# Patient Record
Sex: Male | Born: 1935
Health system: Southern US, Community
[De-identification: ages and names within clinical notes are randomized; demographics above are authoritative.]

## PROBLEM LIST (undated history)

## (undated) DIAGNOSIS — G4700 Insomnia, unspecified: Secondary | ICD-10-CM

## (undated) DIAGNOSIS — N400 Enlarged prostate without lower urinary tract symptoms: Secondary | ICD-10-CM

## (undated) DIAGNOSIS — F329 Major depressive disorder, single episode, unspecified: Secondary | ICD-10-CM

## (undated) DIAGNOSIS — R413 Other amnesia: Secondary | ICD-10-CM

## (undated) DIAGNOSIS — M549 Dorsalgia, unspecified: Secondary | ICD-10-CM

## (undated) DIAGNOSIS — E039 Hypothyroidism, unspecified: Secondary | ICD-10-CM

## (undated) DIAGNOSIS — H269 Unspecified cataract: Secondary | ICD-10-CM

## (undated) DIAGNOSIS — G8929 Other chronic pain: Secondary | ICD-10-CM

## (undated) DIAGNOSIS — F419 Anxiety disorder, unspecified: Secondary | ICD-10-CM

## (undated) DIAGNOSIS — Z860100 Personal history of colon polyps, unspecified: Secondary | ICD-10-CM

## (undated) DIAGNOSIS — R42 Dizziness and giddiness: Secondary | ICD-10-CM

## (undated) DIAGNOSIS — J449 Chronic obstructive pulmonary disease, unspecified: Secondary | ICD-10-CM

## (undated) DIAGNOSIS — E785 Hyperlipidemia, unspecified: Secondary | ICD-10-CM

## (undated) DIAGNOSIS — I2510 Atherosclerotic heart disease of native coronary artery without angina pectoris: Secondary | ICD-10-CM

## (undated) DIAGNOSIS — M1990 Unspecified osteoarthritis, unspecified site: Secondary | ICD-10-CM

## (undated) DIAGNOSIS — F32A Depression, unspecified: Secondary | ICD-10-CM

## (undated) DIAGNOSIS — M2550 Pain in unspecified joint: Secondary | ICD-10-CM

## (undated) DIAGNOSIS — I959 Hypotension, unspecified: Secondary | ICD-10-CM

## (undated) HISTORY — PX: BACK SURGERY: SHX140

## (undated) HISTORY — DX: Chronic obstructive pulmonary disease, unspecified: J44.9

## (undated) HISTORY — DX: Other amnesia: R41.3

## (undated) HISTORY — DX: Dizziness and giddiness: R42

## (undated) HISTORY — DX: Depression, unspecified: F32.A

## (undated) HISTORY — DX: Benign prostatic hyperplasia without lower urinary tract symptoms: N40.0

## (undated) HISTORY — DX: Hypothyroidism, unspecified: E03.9

## (undated) HISTORY — PX: COLONOSCOPY: SHX174

## (undated) HISTORY — DX: Anxiety disorder, unspecified: F41.9

## (undated) HISTORY — DX: Atherosclerotic heart disease of native coronary artery without angina pectoris: I25.10

## (undated) HISTORY — DX: Hyperlipidemia, unspecified: E78.5

## (undated) HISTORY — PX: TONSILLECTOMY: SUR1361

## (undated) HISTORY — PX: CATARACT EXTRACTION, BILATERAL: SHX1313

---
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
## 1986-02-11 HISTORY — PX: ROTATOR CUFF REPAIR: SHX139

## 2003-02-12 HISTORY — PX: BACK SURGERY: SHX140

## 2004-03-01 ENCOUNTER — Ambulatory Visit (HOSPITAL_COMMUNITY): Admission: RE | Admit: 2004-03-01 | Discharge: 2004-03-01 | Payer: Self-pay | Admitting: Neurosurgery

## 2004-03-28 ENCOUNTER — Inpatient Hospital Stay (HOSPITAL_COMMUNITY): Admission: RE | Admit: 2004-03-28 | Discharge: 2004-03-30 | Payer: Self-pay | Admitting: Neurosurgery

## 2005-06-15 IMAGING — CR DG CHEST 2V
2 series · 2 of 2 positions shown · non-contrast
Comparison: none

CLINICAL DATA: hypertension; spondylolisthesis; spinal stenosis; degenerative disc disease 
 TWO VIEW CHEST:

[view not recorded (1 of 2)]
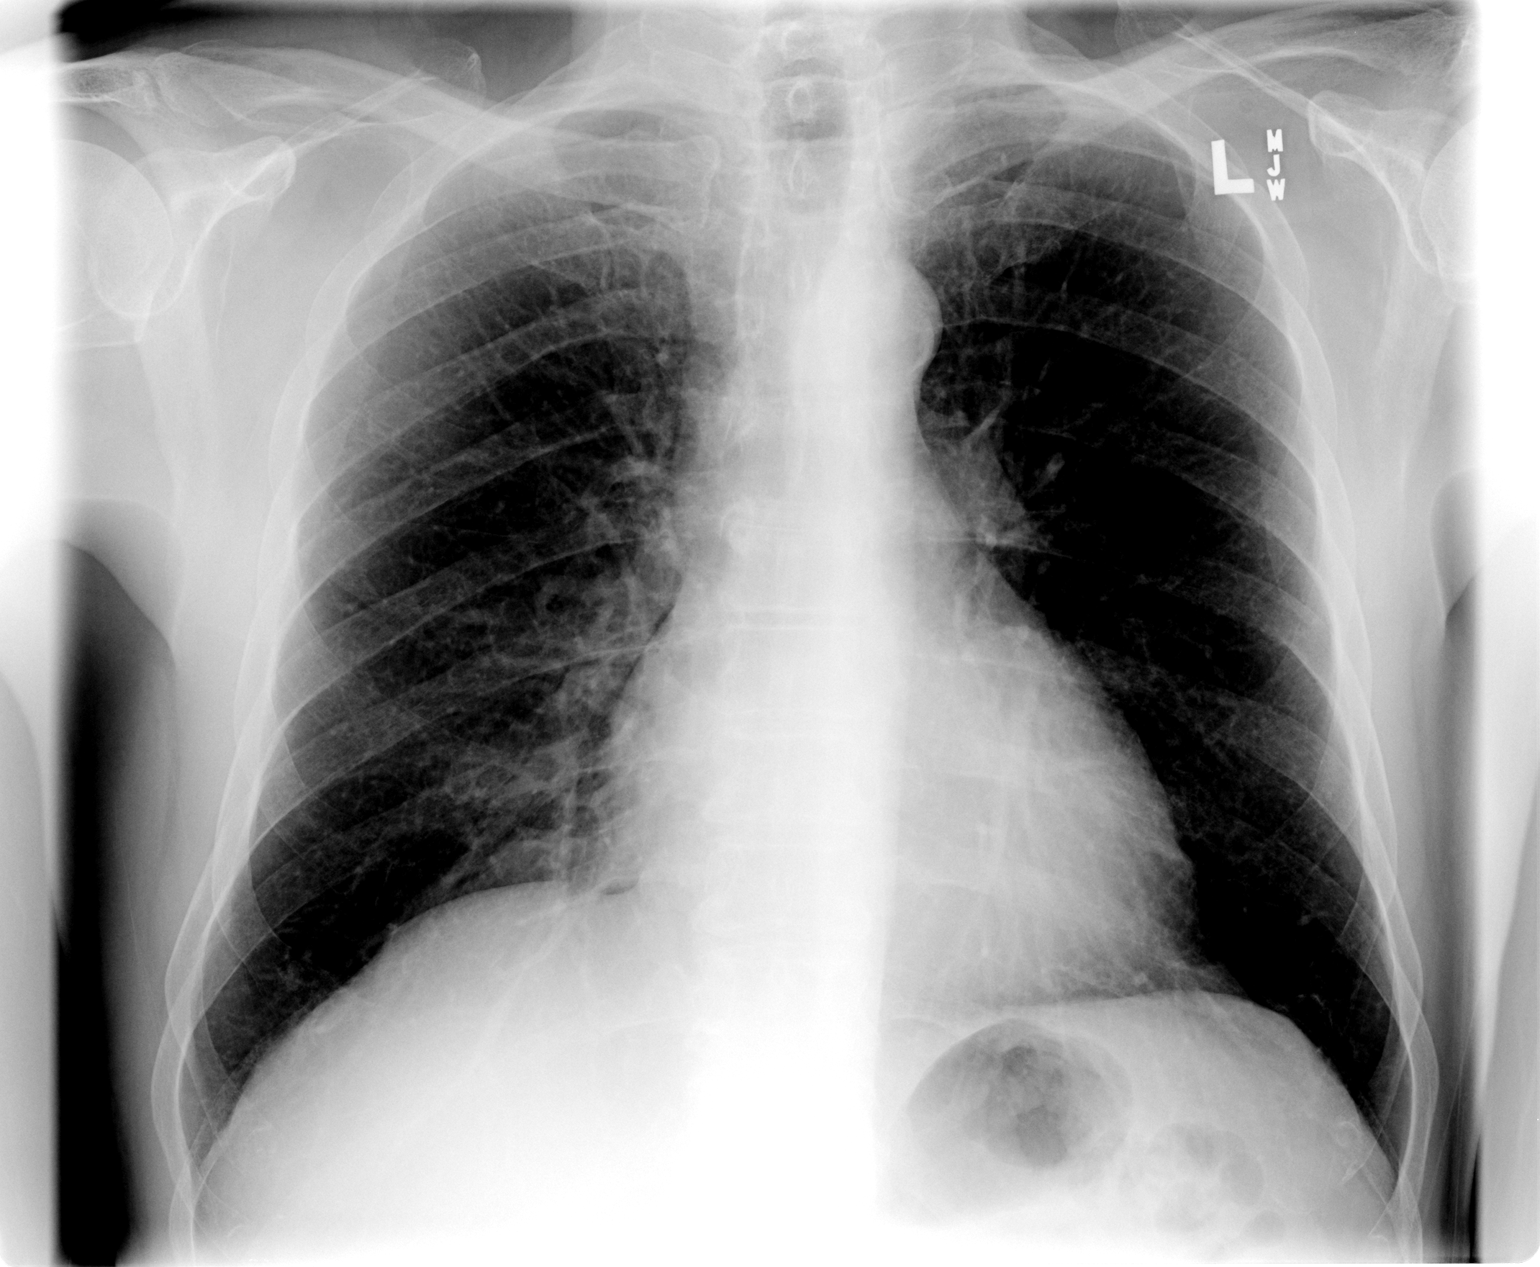

[view not recorded (2 of 2)]
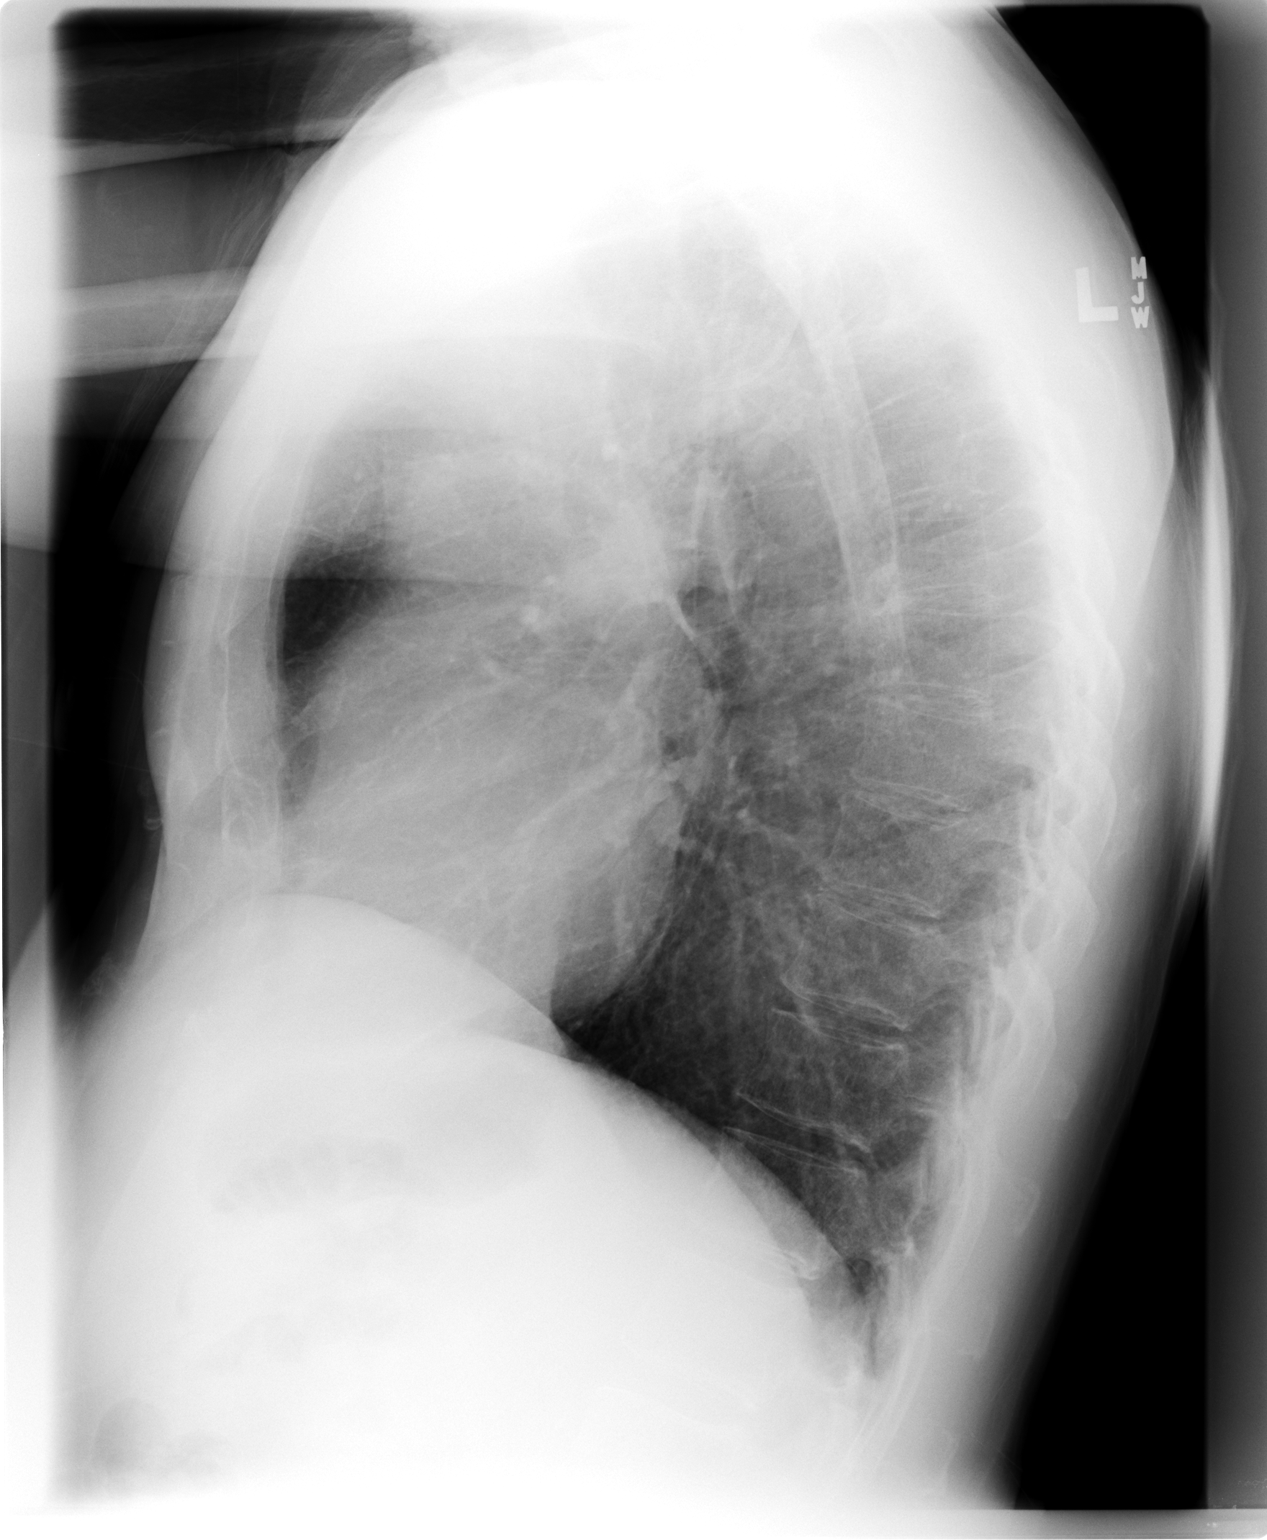

[2 of 2 positions shown; findings below may reference images not displayed]

FINDINGS: Two view exam of the chest shows no focal consolidation, edema or effusion.  Lungs are hyperexpanded.  There is diffuse interstitial coarsening laterally.  Imaged bony structures are intact.
IMPRESSION: Chronic interstitial prominence without acute cardiopulmonary process.

## 2007-01-28 ENCOUNTER — Encounter: Admission: RE | Admit: 2007-01-28 | Discharge: 2007-01-28 | Payer: Self-pay | Admitting: Neurosurgery

## 2009-11-11 HISTORY — PX: CARDIAC CATHETERIZATION: SHX172

## 2009-11-24 ENCOUNTER — Ambulatory Visit: Payer: Self-pay | Admitting: Cardiovascular Disease

## 2009-11-29 ENCOUNTER — Ambulatory Visit: Payer: Self-pay | Admitting: Cardiovascular Disease

## 2009-11-29 ENCOUNTER — Ambulatory Visit (HOSPITAL_COMMUNITY): Admission: RE | Admit: 2009-11-29 | Discharge: 2009-11-29 | Payer: Self-pay | Admitting: Cardiovascular Disease

## 2009-12-07 ENCOUNTER — Ambulatory Visit: Payer: Self-pay | Admitting: Cardiovascular Disease

## 2010-03-08 ENCOUNTER — Ambulatory Visit
Admission: RE | Admit: 2010-03-08 | Discharge: 2010-03-08 | Payer: Self-pay | Source: Home / Self Care | Attending: Cardiovascular Disease | Admitting: Cardiovascular Disease

## 2010-03-09 NOTE — Assessment & Plan Note (Signed)
[facility]                        CARDIOLOGY OFFICE NOTE  MUSTAPHA, COLSON                     MRN:          [id number] DATE:03/08/2010                            DOB:          [birth year]   Mr. Kerlin is a 77 year old gentleman who is here today for a followup visit.  He has the following problem list: 1. Coronary artery disease with possible endothelial dysfunction.     Most recent cardiac catheterization was done in October 2011 which     showed mild-to-moderate coronary artery disease overall without     obstructive lesions.  The left anterior descending coronary artery     had 40% mid stenosis as well as 50% ostial stenosis in the first     diagonal branch.  Right coronary artery had 20-30% lesion.     Ejection fraction was normal. 2. Hyperlipidemia. 3. Benign prostatic hypertrophy. 4. Anxiety. 5. Hypothyroidism. 6. Hypertension.  INTERVAL HISTORY:  Mr. Minotti overall is doing reasonably well since his last visit.  I started him last visit on Imdur 30 mg once daily.  His chest pain has overall improved, but he is having almost daily headache with Imdur.  He is trying to exercise more and the severity of his chest discomfort has decreased significantly.  There has been no significant dyspnea, palpitations, or syncope.  His blood pressure tends to run on the low side, but he has not had any episodes of dizziness.  PHYSICAL EXAMINATION:  VITAL SIGNS: Weight is 174.6 pounds, blood pressure is 93/48, pulse is 64, and oxygen saturation is 96% on room air. NECK:  No JVD or carotid bruits. LUNGS:  Clear to auscultation. HEART:  Regular rate and rhythm with no gallops or murmurs. ABDOMEN:  Benign, nontender, nondistended. EXTREMITIES:  With no clubbing, cyanosis, or edema.  IMPRESSION: 1. Mild class II angina without obstructive coronary artery disease,     likely due to small vessel and endothelial dysfunction.  We will  continue with medical therapy for now.  Unfortunately, he is having     headache likely due to Imdur which will be stopped.  We will     continue with as needed nitroglycerin 0.4 mg given that his     symptoms overall are mild currently.  We will continue with daily     aspirin, metoprolol 25 mg twice daily, and simvastatin 80 mg at     bedtime. 2. Hyperlipidemia:  His blood pressure was optimal with current dose     of simvastatin.     However, I would like to switch him down the road to generic     atorvastatin as it is a safer     medication in this kind of dose.  This will be probably done during     his followup visit later this year.  He will follow up with me in 9     months from now or earlier if needed.    Lorine Bears, MD Electronically Signed   MA/MedQ  DD: 03/08/2010  DT: 03/09/2010  Job #: [id number]

## 2010-04-25 LAB — BASIC METABOLIC PANEL
Chloride: 108 mEq/L (ref 96–112)
Creatinine, Ser: 1.15 mg/dL (ref 0.4–1.5)
GFR calc Af Amer: >60 mL/min (ref >60)
GFR calc non Af Amer: >60 mL/min (ref >60)
Potassium: 4 mEq/L (ref 3.5–5.1)

## 2010-04-25 LAB — CBC
MCV: 95.3 fL (ref 78.0–100.0)
Platelets: 143 10*3/uL — ABNORMAL LOW (ref 150–400)
RBC: 4.23 MIL/uL (ref 4.22–5.81)
WBC: 6.8 10*3/uL (ref 4.0–10.5)

## 2010-04-25 LAB — PROTIME-INR
INR: 0.98 (ref 0.00–1.49)
Prothrombin Time: 13.2 seconds (ref 11.6–15.2)

## 2010-06-26 NOTE — Letter (Signed)
November 24, 2009    Simone Curia, MD  [address]  [place], [place] [address]   RE:  Justin [name], Justin [name]  MRN:  [id number]  /  DOB:  [birth year]   Dear Dr. Nedra Hai:   Thank you for referring Mr. Cardy for further cardiac evaluation.  As  you are aware, this is a pleasant 77 year old gentleman with the  following problem list:  1. Coronary artery disease.  Previous cardiac catheterization in 2007      showed a 70% stenosis in a diagonal branch.  He was treated      medically.  This was done to [facility].  2. Hyperlipidemia.  3. Benign prostatic hypertrophy.  4. Anxiety.  5. Hypothyroidism.  6. Hypertension.   Mr. Gander is here today for evaluation of chest pain.  He did have  previous history of chest pain in 2000 and 2007.  At that time he  underwent cardiac catheterization which showed a 70% stenosis of a  diagonal branch, which was treated medically.  He has not had any  cardiac workup since then.  Over the last few weeks he started having  substernal chest tightness which is happening every time he goes uphill.  It lasts for 5 or 10 minutes and resolves with rest.  The discomfort is  substernal and occasionally goes to his right arm.  It is associated  with dyspnea but no syncope or presyncope.  He sometimes feels  palpitations during these episodes.  He used nitroglycerin in the past  which completely resolved his symptoms, but he does not use it now due  to headache.  He cut down on his physical activities and exercise at the  gym due to his new symptoms.   MEDICATIONS:  1. Levothyroxine 100 mcg once daily.  2. Fluticasone nasal spray.  3. Terazosin 5 mg once daily.  4. Clonazepam 0.5 mg as needed.  5. Metoprolol tartrate 25 mg twice daily.  6. Simvastatin 80 mg at bedtime.  7. Aspirin 81 mg once daily.  8. Multivitamin once daily.   ALLERGIES:  NEOSPORIN.   SOCIAL HISTORY:  Negative for smoking or recreational drug use.  He  drinks 3 beers a week.  He is  active and exercises regularly.  However,  he cut down significantly on exercise due to his recent symptoms.  The  patient is married.  He is retired from [employer].   PAST SURGICAL HISTORY:  Right shoulder surgery as well as back surgery.   FAMILY HISTORY:  His father had myocardial infarction at the age of 79.  He died at the age of 16.   REVIEW OF SYSTEMS:  This is remarkable for exertional chest pain,  dyspnea and occasional palpitations.  A full review of systems was  performed and is otherwise negative.   PHYSICAL EXAMINATION:  GENERAL:  The patient is pleasant and appears to  be in no acute distress.  VITAL SIGNS:  Weight is 173.6 pounds, blood pressure is 105/62, pulse is  66, oxygen saturation is 96% on room air.  HEENT:  Normocephalic, atraumatic.  NECK:  No JVD or carotid bruits.  RESPIRATORY:  Normal respiratory effort with no use of accessory  muscles.  Auscultation reveals normal breath sounds.  CARDIOVASCULAR:  Normal PMI.  Normal S1 and S2 with no gallops or  murmurs.  ABDOMEN:  Benign, nontender, nondistended.  EXTREMITIES:  No clubbing, cyanosis or edema.  SKIN:  Warm and dry with no rash.  PSYCHIATRIC:  He  is alert, oriented x3, with normal mood and affect.  MUSCULOSKELETAL:  Normal muscle strength in the upper and lower  extremities.   An electrocardiogram was performed and interpreted by me that showed  normal sinus rhythm with no significant ST or T-wave changes.   IMPRESSION:  1. Progressive chest tightness suggestive of class III angina:  His      symptoms are highly suggestive of angina.  His discomfort is always      exertional with activities and exercise.  He actually has cut down      significantly on his activities due to his symptoms.  This is now      actually interfering with his activities of daily living.  Due to      these symptoms and previous cardiac history, I recommend proceeding      with cardiac catheterization and possible coronary  intervention.      Risks, benefits and alternatives were discussed with him.  In the      meantime we will continue with aspirin once daily as well as      metoprolol.  His blood pressure is well-controlled.  2. Hyperlipidemia:  His most recent lipid profile showed a total      cholesterol of 128, triglyceride 83, HDL of 42 and an LDL of 69.      All his parameters are at target.  He is currently on high-dose      simvastatin.  I explained to him that there has been a recent ADA      warning about using high-dose simvastatin 80 mg due to increased      risk of myopathy.  However, he had been stable on this dose for 2      years and thus I think it is reasonable to continue with current      dose.   The patient will follow up with me after cardiac catheterization.  Thank  you for allowing me to participate in the care of your patient.    Sincerely,      Lorine Bears, MD  Electronically Signed    MA/MedQ  DD: 11/24/2009  DT: 11/24/2009  Job #: [id number]

## 2010-06-26 NOTE — Assessment & Plan Note (Signed)
[facility]                        Justin CARDIOLOGY OFFICE NOTE   [name], Justin [name]                     MRN:          [id number]  DATE:12/07/2009                            DOB:          [birth year]    Justin [name] is a 77 year old gentleman who is here today for a followup  visit.  He has the following problem list:  1. Coronary artery disease with possible endothelial dysfunction.      Most recent cardiac catheterization was done by me in October of      this year which showed overall mild-to-moderate coronary artery      disease without obstructive lesions.  Left anterior descending      coronary artery had a 40% mid stenosis as well as 50% ostial      stenosis in the first diagonal branch.  Right coronary artery had      20-30% disease.  Ejection fraction was normal.  2. Hyperlipidemia.  3. Benign prostatic hypertrophy.  4. Anxiety.  5. Hypothyroidism.  6. Hypertension.   I saw Justin [name] recently for chest pain suggestive of progressive  angina.  He underwent cardiac catheterization and the results are  discussed above.  He has not been doing much physical activities, thus  has not had any further episodes of chest pain.  He usually gets chest  discomfort when he is walking uphill.  He does not get the discomfort  going down.  It lasts for about 5-10 minutes and resolves with rest.   PHYSICAL EXAMINATION:  VITAL SIGNS:  Weight is 170.2 pounds, blood  pressure is 114/71, pulse is 67, oxygen saturation is 97% on room air.  NECK:  Reveals no JVD or carotid bruits.  LUNGS:  Clear to auscultation.  HEART:  Regular rate and rhythm with no gallops or murmurs.  ABDOMEN:  Benign, nontender, nondistended.  EXTREMITIES:  With no clubbing, cyanosis, or edema.  The right radial  area is intact with no hematoma.  The radial pulse is normal.  He does  have a bruise that goes all the way below the elbow, but it is soft with  no evidence of soft tissue  hematoma.   IMPRESSION:  1. Chest discomfort suggestive of angina without obstructive coronary      artery disease.  He likely has endothelial dysfunction.  Based on      his recent cardiac catheterization, no angioplasty is needed.  He      does have mild-to-moderate coronary artery disease and thus will      need to be treated aggressively.  We will continue with a daily      aspirin as well as metoprolol.  Today, we will start him on Imdur      30 mg once daily and give him nitroglycerin sublingual as needed      also.  I instructed him to resume his physical activities and start      his regular exercise program.  If his chest discomfort continues      then we will consider Ranexa.  2. Hyperlipidemia:  His most recent lipid profile was a target  with      current dose of simvastatin 80 mg at bedtime.  He has been on this      dose for 2 years without any side effects.  Thus, I      will not make any changes today.  In the future, I will consider      switching him to atorvastatin      once it is become available in a generic form in order to avoid the      high-dose simvastatin.  He will follow up with me in 3 months from      now or earlier if needed.     Lorine Bears, MD  Electronically Signed    MA/MedQ  DD: 12/07/2009  DT: 12/08/2009  Job #: [id number]

## 2010-06-29 NOTE — Op Note (Signed)
NAME:  Justin [name], GENERAL NO.:  [id number]   MEDICAL RECORD NO.:  [id number]          PATIENT TYPE:  INP   LOCATION:  2857                         FACILITY:  [facility]   PHYSICIAN:  Hewitt Shorts, M.D.DATE OF BIRTH:  1933-11-02   DATE OF PROCEDURE:  03/28/2004  DATE OF DISCHARGE:                                 OPERATIVE REPORT   PREOPERATIVE DIAGNOSES:  1.  L5 S1 dynamic degenerative spondylolisthesis.  2.  Lumbar stenosis.  3.  Lumbar spondylosis.  4.  Lumbar degenerative joint disease.  5.  Lumbar radiculopathy.   POSTOPERATIVE DIAGNOSES:  1.  L5 S1 dynamic degenerative spondylolisthesis.  2.  Lumbar stenosis.  3.  Lumbar spondylosis.  4.  Lumbar degenerative joint disease.  5.  Lumbar radiculopathy.   PROCEDURES:  1.  L5 Gill procedure.  2.  L5 S1 posterior lumbar interbody fusion with interbody implants and      VITOSS with bone marrow aspirate and bilateral L5-S1 posterolateral      arthrodesis with 90D posterior instrumentation, and VITOSS with bone      marrow aspirate.   SURGEON:  Shirlean Kelly, M.D.   Threasa HeadsDanielle Dess.   ANESTHESIA:  General endotracheal.   INDICATION:  Patient is a 77 year old man who presented with low back pain  and pain extending to the right lower extremity.  He was found to have a  dynamic degenerative spondylolisthesis at L5 S1 with significant lumbar  stenosis.  There was advanced facet arthropathy and underlying spondylosis  and degenerative disk disease.  It should be noted that the patient had 6  lumbar type vertebra and at the level of pathology was the second disk space  level up from the fused portion of the sacrum.   PROCEDURE:  The patient brought to the operating room and placed under  general endotracheal anesthesia.  Patient was turned to a prone position,  the lumbar region was prepped with Betadine soap and solution and draped in  a sterile fashion.  The midline was infiltrated with local anesthetic  with  epinephrine.  L5 S1 level identified and midline incisions were made,  carried down through the subcutaneous tissue.  Bipolar cautery and  electrocautery used to maintain hemostasis.  Dissection was carried down to  the lumbar fascia which was incised bilaterally and the paraspinal muscles  resected from the spinous process of lamina in a subperiosteal fashion.  A  self-retaining retractor was placed and x-rays taken and the L5 S1 level  identified.  Bilateral L5 Gill procedure was performed with laminectomy and  facetectomy.  The facets were markedly arthropathic with a large gap between  the articular surfaces.  There was significant spine overgrowth with  extruding synovium.  Decompression was extended laterally so as to  decompress the neural foramen.  Ligamentum flavum was removed and we  identified the thecal sac and exudate nerve roots bilaterally.  We then  draped the microscope and it was brought into the field to provide  additional magnification with illumination and visualization and the  remainder of the decompression was performed using microdissection and  microsurgical technique.  Posterior ossific overgrowth was removed using an  osteophyte removal tool as well as osteotomes.  The cartilaginous end-plate  was removed using microcurets and a thorough diskectomy was performed with  good decompression of the thecal sac and nerve roots and the end plates were  prepared for the arthrodesis.  We then probed the pedicles at S1 bilaterally  and aspirated bone marrow aspirate which was injected over two 10 mL strips  of VITOSS.  We then sized the disk space to an 11 mm height and selected 11  mm in height grafts 25 mm in depth with 0 degrees of lordosis.  They were  packed with the VITOSS with bone marrow aspirate and then carefully  positioned in the intervertebral disk space, gently retracting the thecal  sac and nerve root.  We then packed additional VITOSS with bone  marrow  aspirate between the implants and lateral to them, filling the remainder of  the intervertebral disk space with the VITOSS with bone marrow aspirate.  The C-arm was then draped and brought in the field to assist [place] with  positioning of the posterior instrumentation and using AP & lateral  projections the pedicles at L5 were probed as well and then pedicles at L5  and S1 were examined with a ball probe, no cut-outs were found.  Each of the  pedicles was tapped with a 5.25 tap.  They were again examined with a ball  probe, again no cut-outs were found, and then we placed at the L5 level 6.75  x 45 mm screws and at the S1 level 6.75 x 40 mm screws.  Once the screws  were in place, we cut a 60 mm rod, slightly asymmetrically, placing a  slightly longer rod on the left than the right due to the position of the  screw heads, and then locking capsule placed and these were tightened  against the counter-torque.  We then took the additional VITOSS bone marrow  aspirate and, having exposed and decorticated the transverse processes of L5  and S1 bilaterally, packed the VITOSS bone marrow aspirate in the  intertransverse gutter and over the transverse processes themselves  bilaterally.  The wound had been irrigated on numerous occasions throughout  the procedure with initially saline solution and subsequently with  Bacitracin solution and once the arthrodesis was completed we again examined  the thecal sac and nerve roots.  There was no compression of them, they had  been well decompressed, and we proceeded with closure.  The deep fascia  closed with interrupted undyed 1 Vicryl suture, the subcutaneous and  subcuticular closed with interrupted inverted 2-0 Vicryl sutures and skin  was approximated with Dermabond.  The patient tolerated the procedure well.  The wound was further dressed with Adaptic and sterile gauze and Hypafix. Estimated blood loss was 350 mL.  The patient was given back 200  mL of cell  saver blood.  Sponge and needle count were correct.  Following surgery the  patient was turned back to a supine position to be reversed from anesthetic,  extubated and was transferred to the recovery room for further care.      RWN/MEDQ  D:  03/28/2004  T:  03/28/2004  Job:  [id number]

## 2010-06-29 NOTE — H&P (Signed)
NAME:  Justin [name], Justin [name] NO.:  [id number]   MEDICAL RECORD NO.:  [id number]          PATIENT TYPE:  INP   LOCATION:  2857                         FACILITY:  [facility]   PHYSICIAN:  Hewitt Shorts, M.D.DATE OF BIRTH:  [birth year]   DATE OF ADMISSION:  03/28/2004  DATE OF DISCHARGE:                                HISTORY & PHYSICAL   HISTORY OF PRESENT ILLNESS:  The patient is a 77 year old right handed white  male who has had difficulties with low back pain from the right side of his  lower back radiating down the right lower extremity to the right buttock,  posterior thigh, and leg with numbness and tingling into the right foot.  He  denies any left sided pain or discomfort.  He has been having gradually  worsening difficulties over the past 3-4 years.  He was initially evaluated  nearly a year ago and returned last month because of worsening discomfort.  He has been treated with a number of different measures including NSAIDs,  narcotic analgesics.  The patient has an MRI scan that showed six lumbar  vertebrae, numbering them L1 to L5 then S1.  The L5-S1 level, the disc space  level second up from the fused portion of the sacrum, and a dynamic  degenerative spondylolisthesis at L5-S1 secondary to advanced facet  arthropathy, with resulting severe spinal stenosis and foraminal stenosis,  right worse than left.  The patient is admitted now for decompression and  arthrodesis.   PAST MEDICAL HISTORY:  Notable for a history of hypercholesterolemia which  has been treated for the past 16 years, history of hypothyroidism, treated  for the past six years, he has not described any history of hypertension,  myocardial infarction, cancer, stroke, diabetes, peptic ulcer disease, lung  disease.   ALLERGIES:  None.   CURRENT MEDICATIONS:  Zocor 40 mg daily, Synthroid 0.05 mg daily, Metoprolol  50 mg daily, Terazosin 5 mg q.h.s.   FAMILY HISTORY:  His mother died at age 66,  she had a long history of breast  cancer for over 20 years.  His father died at age 9, he had lung cancer.   SOCIAL HISTORY:  The patient is married, he is retired as of September 2002.  He does not smoke.  He does alcoholic beverages socially.  He denies history  of substance abuse.   REVIEW OF SYMPTOMS:  Notable for as described in the history of present  illness and past medical history, but is, otherwise, unremarkable.   PHYSICAL EXAMINATION:  GENERAL:  The patient is a well developed, well nourished white male in no  acute distress.  VITAL SIGNS:  Temperature 98.4, pulse 59, blood pressure 96/56, respirations  rate 16, height 5 feet 10 inches, weight 169 pounds.  LUNGS:  Clear to auscultation, he has symmetric respirations.  HEART:  Regular rate and rhythm with normal S1 and S2, no murmur.  ABDOMEN:  Soft, nontender, nondistended, bowel sounds present.  EXTREMITIES:  No cyanosis, clubbing, and edema.  MUSCULOSKELETAL:  No tenderness of the lumbar spine, he is able flex 90  degrees, straight leg-raising  negative bilaterally.  NEUROLOGICAL EXAMINATION:  5/5 strength in the distal lower extremities  including dorsiflexion and plantar flexion.  Sensory examination is intact  sensation to pin prick.  Reflexes are trace in the quadriceps, symmetrical.  Toes are downgoing bilaterally.  He has a normal gait and stance.   IMPRESSION:  Patient with L5-S1 degenerative spondylolisthesis secondary to  advanced facet arthropathy with resulting severe spinal stenosis and  foraminal stenosis with underlying spondylosis and degenerative disc  disease.   PLAN:  The decision was made to proceed with L5 Gill procedure, L5-S1  partial lumbar interbody fusion with interbody implants, and posterolateral  arthrodesis with posterior instrumentation.  We discussed the nature of the  surgery, alternatives of the surgery, hospital stay and overall  recuperation, his limitations postoperatively, the need  for postoperative  immobilization and lumbar corset, and risks of surgery including the risks  of infection, bleeding, possible transfusion, risks of nerve dysfunction,  pain, weakness, numbness, or paresthesias, the risks of dural tears and CSF  leak, possible need for further surgery, the risks of failure of the  arthrodesis, and anesthetic risks of myocardial infarction, stroke,  pneumonia, and death.  After discussing all this, he would like to go ahead  with surgery and is admitted for such.      RWN/MEDQ  D:  03/28/2004  T:  03/28/2004  Job:  [id number]

## 2010-09-12 ENCOUNTER — Encounter: Payer: Self-pay | Admitting: Cardiovascular Disease

## 2010-10-08 ENCOUNTER — Encounter: Payer: Self-pay | Admitting: Cardiovascular Disease

## 2010-10-22 ENCOUNTER — Ambulatory Visit: Payer: Self-pay | Admitting: Cardiovascular Disease

## 2010-10-25 ENCOUNTER — Encounter: Payer: Self-pay | Admitting: Cardiovascular Disease

## 2010-10-25 ENCOUNTER — Ambulatory Visit: Payer: Self-pay | Admitting: Cardiovascular Disease

## 2010-10-25 ENCOUNTER — Ambulatory Visit (INDEPENDENT_AMBULATORY_CARE_PROVIDER_SITE_OTHER): Payer: Medicare Other | Admitting: Cardiovascular Disease

## 2010-10-25 DIAGNOSIS — I10 Essential (primary) hypertension: Secondary | ICD-10-CM | POA: Insufficient documentation

## 2010-10-25 DIAGNOSIS — E785 Hyperlipidemia, unspecified: Secondary | ICD-10-CM

## 2010-10-25 DIAGNOSIS — I2510 Atherosclerotic heart disease of native coronary artery without angina pectoris: Secondary | ICD-10-CM

## 2010-10-25 NOTE — Assessment & Plan Note (Signed)
The patient seems to be stable overall. He has occasional symptoms of chest pain which usually responds one nitroglycerin. I recommend continuing medical therapy. He is taking aspirin 81 mg once daily as well as metoprolol 25 mg twice daily. He is no longer on Imdur due to headaches. I recommend yearly followup with cardiology.

## 2010-10-25 NOTE — Assessment & Plan Note (Signed)
His blood pressure is well controlled. Continue current medications. 

## 2010-10-25 NOTE — Assessment & Plan Note (Signed)
The patient has been taking high-dose simvastatin 80 mg once daily for many years without reported that myalgia. It's reasonable to continue with this medication at this time. However, I favor that we switch him to atorvastatin in the future once the pricing the generic it is better than what it is now. The patient isn't able to afford it at this time.

## 2010-10-25 NOTE — Progress Notes (Signed)
HPI  This is a 77 year old male who is here today for a routine followup visit. He has history of mild to moderate coronary artery disease with recurrent chest pain and suspected endothelial dysfunction. Overall, he has been doing reasonably well. Since his last visit, he had few episodes of chest pain that required nitroglycerin. He had no prolonged episodes though. He denies any dyspnea. There has been no palpitations, syncope or presyncope.  Allergies  Allergen Reactions  . Neosporin (Neomycin-Polymyx-Gramicid)   . Imdur (Isosorbide Mononitrate)     Headache     Current Outpatient Prescriptions on File Prior to Visit  Medication Sig Dispense Refill  . clonazePAM (KLONOPIN) 0.5 MG tablet Take 0.5 mg by mouth as needed.        . fluticasone (FLONASE) 50 MCG/ACT nasal spray Place 2 sprays into the nose daily.        Marland Kitchen HYDROcodone-acetaminophen (VICODIN ES) 7.5-750 MG per tablet Take 1 tablet by mouth every 6 (six) hours as needed.        Marland Kitchen levothyroxine (SYNTHROID, LEVOTHROID) 100 MCG tablet Take 100 mcg by mouth daily.        . metoprolol tartrate (LOPRESSOR) 25 MG tablet Take 25 mg by mouth 2 (two) times daily.        . Multiple Vitamin (MULTIVITAMIN) tablet Take 1 tablet by mouth daily.        . nitroGLYCERIN (NITROSTAT) 0.4 MG SL tablet Place 0.4 mg under the tongue every 5 (five) minutes as needed.        . simvastatin (ZOCOR) 80 MG tablet Take 80 mg by mouth at bedtime.        Marland Kitchen terazosin (HYTRIN) 5 MG capsule Take 5 mg by mouth at bedtime.           Past Medical History  Diagnosis Date  . BPH (benign prostatic hypertrophy)   . Anxiety   . Hypothyroidism   . CAD (coronary artery disease)   . HLD (hyperlipidemia)   . HTN (hypertension)      Past Surgical History  Procedure Date  . Rotator cuff repair 1988    Right  . Back surgery 2007  . Cardiac catheterization 11/2009    Had 3 previous cath. Most recent was in 2011. LAD: 40% mid stenosis, D1: 50 ostial , RCA: 20-30%.  Normal EF     Family History  Problem Relation Age of Onset  . Cancer    . Heart disease    . Heart attack       History   Social History  . Marital Status: Married    Spouse Name: N/A    Number of Children: 2  . Years of Education: N/A   Occupational History  . Retired Biomedical engineer   Social History Main Topics  . Smoking status: Never Smoker   . Smokeless tobacco: Not on file  . Alcohol Use: 1.8 oz/week    3 Cans of beer per week  . Drug Use: No  . Sexually Active: Not on file   Other Topics Concern  . Not on file   Social History Narrative  . No narrative on file      PHYSICAL EXAM   BP 110/63  Pulse 63  Ht 5\' 11"  (1.803 m)  Wt 169 lb (76.658 kg)  BMI 23.57 kg/m2  SpO2 96%  Constitutional: He is oriented to person, place, and time. He appears well-developed and well-nourished. No distress.  HENT: No nasal discharge.  Head: Normocephalic and atraumatic.  Eyes: Pupils are equal, round, and reactive to light. Right eye exhibits no discharge. Left eye exhibits no discharge.  Neck: Normal range of motion. Neck supple. No JVD present. No thyromegaly present.  Cardiovascular: Normal rate, regular rhythm, normal heart sounds and intact distal pulses. Exam reveals no gallop and no friction rub.  No murmur heard.  Pulmonary/Chest: Effort normal and breath sounds normal. No stridor. No respiratory distress. He has no wheezes. He has no rales. He exhibits no tenderness.  Abdominal: Soft. Bowel sounds are normal. He exhibits no distension. There is no tenderness. There is no rebound and no guarding.  Musculoskeletal: Normal range of motion. He exhibits no edema and no tenderness.  Neurological: He is alert and oriented to person, place, and time. Coordination normal.  Skin: Skin is warm and dry. No rash noted. He is not diaphoretic. No erythema. No pallor.  Psychiatric: He has a normal mood and affect. His behavior is normal. Judgment and thought content normal.         ASSESSMENT AND PLAN

## 2010-10-25 NOTE — Patient Instructions (Signed)
Your physician recommends that you schedule a follow-up appointment in: 1 year  

## 2010-11-02 ENCOUNTER — Encounter: Payer: Self-pay | Admitting: Cardiovascular Disease

## 2010-12-28 ENCOUNTER — Institutional Professional Consult (permissible substitution): Payer: Medicare Other | Admitting: Emergency Medicine

## 2011-04-16 ENCOUNTER — Institutional Professional Consult (permissible substitution): Payer: Medicare Other | Admitting: Emergency Medicine

## 2011-10-04 ENCOUNTER — Ambulatory Visit (INDEPENDENT_AMBULATORY_CARE_PROVIDER_SITE_OTHER): Payer: Medicare Other | Admitting: Emergency Medicine

## 2011-10-04 ENCOUNTER — Encounter: Payer: Self-pay | Admitting: Emergency Medicine

## 2011-10-04 VITALS — BP 112/60 | HR 68 | Temp 98.7°F | Ht 71.0 in | Wt 166.6 lb

## 2011-10-04 DIAGNOSIS — R05 Cough: Secondary | ICD-10-CM

## 2011-10-04 DIAGNOSIS — R053 Chronic cough: Secondary | ICD-10-CM | POA: Insufficient documentation

## 2011-10-04 MED ORDER — OMEPRAZOLE 20 MG PO CPDR: 20.0000 mg | DELAYED_RELEASE_CAPSULE | Freq: Every day | ORAL | Status: DC

## 2011-10-04 NOTE — Progress Notes (Signed)
Subjective:    Patient ID: Justin [name], male    DOB: 03-26-1933, 78 y.o.   MRN: [id number] HPI 78 yo never smoker, hx hyperlipidemia, hypothyroidism, allergies, BPH.  He is referred for cough. Has been bothering him for over 2 years. Occasionally it is productive, brings up clear phlegm, sometimes yellow. It seemed to get better once when he was on a medication, but he can't remember what it was. He is on fluticasone qhs, no longer on loratadine, albuterol or tessalon.   He does have noise when he breathes, voice will go away with mowing or exercise. He does not have heartburn.   CXR 06/01/10 -- chronically elevated R HD (dates back to 2006), no infiltrates.    Review of Systems  Constitutional: Positive for unexpected weight change. Negative for fever, chills, diaphoresis, activity change, appetite change and fatigue.  HENT: Positive for congestion, voice change and postnasal drip. Negative for hearing loss, ear pain, nosebleeds, sore throat, rhinorrhea, sneezing, mouth sores, trouble swallowing, dental problem, sinus pressure and tinnitus.   Eyes: Negative for visual disturbance.  Respiratory: Positive for cough, choking, chest tightness and stridor. Negative for shortness of breath and wheezing.   Cardiovascular: Negative for chest pain and palpitations.  Gastrointestinal: Negative for nausea, vomiting, abdominal pain and constipation.  Genitourinary: Positive for urgency. Negative for difficulty urinating.  Musculoskeletal: Negative for myalgias, back pain, joint swelling, arthralgias and gait problem.  Skin: Negative for rash.  Neurological: Negative for dizziness, tremors, seizures, syncope, weakness, light-headedness, numbness and headaches.  Hematological: Does not bruise/bleed easily.  Psychiatric/Behavioral: Positive for disturbed wake/sleep cycle. Negative for confusion and agitation. The patient is not nervous/anxious.     Past Medical History  Diagnosis Date  . BPH  (benign prostatic hypertrophy)   . Anxiety   . Hypothyroidism   . CAD (coronary artery disease)   . HLD (hyperlipidemia)   . HTN (hypertension)      Family History  Problem Relation Age of Onset  . Cancer Mother     breast  . Heart disease    . Heart attack    . Asthma Father      History   Social History  . Marital Status: Married    Spouse Name: N/A    Number of Children: 2  . Years of Education: N/A   Occupational History  . Retired Biomedical engineer   Social History Main Topics  . Smoking status: Never Smoker   . Smokeless tobacco: Never Used  . Alcohol Use: 1.8 oz/week    3 Cans of beer per week  . Drug Use: No  . Sexually Active: Not on file   Other Topics Concern  . Not on file   Social History Narrative  . No narrative on file   Was in the [employer], was in [place]. Retired from [employer].   Allergies  Allergen Reactions  . Neosporin (Neomycin-Polymyxin-Gramicidin)   . Imdur (Isosorbide Mononitrate)     Headache     Outpatient Prescriptions Prior to Visit  Medication Sig Dispense Refill  . aspirin 81 MG tablet Take 81 mg by mouth daily.        . clonazePAM (KLONOPIN) 0.5 MG tablet Take 0.5 mg by mouth as needed.        . fluticasone (FLONASE) 50 MCG/ACT nasal spray Place 2 sprays into the nose daily.        Marland Kitchen HYDROcodone-acetaminophen (VICODIN ES) 7.5-750 MG per tablet Take 1 tablet by mouth every 6 (six) hours as needed.        Marland Kitchen  levothyroxine (SYNTHROID, LEVOTHROID) 100 MCG tablet Take 100 mcg by mouth daily.        . metoprolol tartrate (LOPRESSOR) 25 MG tablet Take 25 mg by mouth 2 (two) times daily.        . Multiple Vitamin (MULTIVITAMIN) tablet Take 1 tablet by mouth daily.        . nitroGLYCERIN (NITROSTAT) 0.4 MG SL tablet Place 0.4 mg under the tongue every 5 (five) minutes as needed.        . simvastatin (ZOCOR) 80 MG tablet Take 80 mg by mouth at bedtime.        Marland Kitchen terazosin (HYTRIN) 5 MG capsule Take 5 mg by mouth at bedtime.               Objective:    Physical Exam  Gen: Pleasant, thin, in no distress,  normal affect  ENT: No lesions,  mouth clear,  oropharynx clear, no postnasal drip, hoarse voice  Neck: No JVD, no TMG, no carotid bruits  Lungs: No use of accessory muscles, no dullness to percussion, clear without rales or rhonchi  Cardiovascular: RRR, heart sounds normal, no murmur or gallops, no peripheral edema  Musculoskeletal: No deformities, no cyanosis or clubbing  Neuro: alert, non focal  Skin: Warm, no lesions or rashes     Assessment & Plan:  Chronic cough ? Components of PND, possible GERD. Denies aspiration sx, asthma - add back ;loratadine to fluticasone and take nasal spray earlier in the evening - omeprazole bid x 1 week, then to qd - rov 1 month. If still a problem then PFT, consider further w/u including FOB

## 2011-10-04 NOTE — Patient Instructions (Signed)
Continue your fluticasone nasal spray, 2 sprays each side about 1 hour before bedtime Restart loratadine 10mg  daily Start omeprazole 20mg  twice a day for 1 week, then decrease to once a day Follow with Dr Delton Coombes in 1 month. If your cough hasn't improved we will talk about further testing.

## 2011-10-04 NOTE — Assessment & Plan Note (Addendum)
?   Components of PND, possible GERD. Denies aspiration sx, asthma - add back ;loratadine to fluticasone and take nasal spray earlier in the evening - omeprazole bid x 1 week, then to qd - rov 1 month. If still a problem then PFT, consider further w/u including FOB

## 2011-11-15 ENCOUNTER — Ambulatory Visit: Payer: Medicare Other | Admitting: Emergency Medicine

## 2011-12-09 ENCOUNTER — Ambulatory Visit (INDEPENDENT_AMBULATORY_CARE_PROVIDER_SITE_OTHER): Payer: Medicare Other | Admitting: Emergency Medicine

## 2011-12-09 ENCOUNTER — Encounter: Payer: Self-pay | Admitting: Emergency Medicine

## 2011-12-09 VITALS — BP 140/82 | HR 72 | Temp 97.9°F | Ht 71.0 in | Wt 170.8 lb

## 2011-12-09 DIAGNOSIS — R05 Cough: Secondary | ICD-10-CM

## 2011-12-09 DIAGNOSIS — Z23 Encounter for immunization: Secondary | ICD-10-CM

## 2011-12-09 MED ORDER — AZELASTINE HCL 0.1 % NA SOLN: NASAL | Status: DC

## 2011-12-09 NOTE — Progress Notes (Signed)
  Subjective:    Patient ID: Justin [name], male    DOB: February 10, 1934, 78 y.o.   MRN: [id number] HPI 78 yo never smoker, hx hyperlipidemia, hypothyroidism, allergies, BPH.  He is referred for cough. Has been bothering him for over 2 years. Occasionally it is productive, brings up clear phlegm, sometimes yellow. It seemed to get better once when he was on a medication, but he can't remember what it was. He is on fluticasone qhs, no longer on loratadine, albuterol or tessalon.   He does have noise when he breathes, voice will go away with mowing or exercise. He does not have heartburn.   CXR 06/01/10 -- chronically elevated R HD (dates back to 2006), no infiltrates.   ROV 12/09/11 -- follows up for cough. Last time we started omeprazole, added back loratadine to fluticasone. His cough is better on the omeprazole. He still has lots of nasal gtt and drainage.       Objective:   Physical Exam Filed Vitals:   12/09/11 1533  BP: 140/82  Pulse: 72  Temp: 97.9 F (36.6 C)    Gen: Pleasant, thin, in no distress,  normal affect  ENT: No lesions,  mouth clear,  oropharynx clear, no postnasal drip, hoarse voice  Neck: No JVD, no TMG, no carotid bruits  Lungs: No use of accessory muscles, no dullness to percussion, clear without rales or rhonchi  Cardiovascular: RRR, heart sounds normal, no murmur or gallops, no peripheral edema  Musculoskeletal: No deformities, no cyanosis or clubbing  Neuro: alert, non focal  Skin: Warm, no lesions or rashes     Assessment & Plan:  Chronic cough Continue the loratadine 10mg  daily Increase your fluticasone nasal spray to 2 sprays each side twice a day Try using nasal saline irrigation once daily - this may significantly help your nasal drainage Continue to take omeprazole 20mg  daily If you continue to have nasal drainage on the medications above, fill prescription for astelin nasal spray. Use 2 sprays each nostril 2 to 3 times a day.  Follow with Dr  Delton Coombes if your cough does not resolve >> will pursue PFT and possibly FOB at that time if cough

## 2011-12-09 NOTE — Patient Instructions (Addendum)
Continue the loratadine 10mg  daily Increase your fluticasone nasal spray to 2 sprays each side twice a day Try using nasal saline irrigation once daily - this may significantly help your nasal drainage Continue to take omeprazole 20mg  daily If you continue to have nasal drainage on the medications above, fill prescription for astelin nasal spray. Use 2 sprays each nostril 2 to 3 times a day.  Follow with Dr Delton Coombes if your cough does not resolve

## 2011-12-09 NOTE — Assessment & Plan Note (Signed)
Continue the loratadine 10mg  daily Increase your fluticasone nasal spray to 2 sprays each side twice a day Try using nasal saline irrigation once daily - this may significantly help your nasal drainage Continue to take omeprazole 20mg  daily If you continue to have nasal drainage on the medications above, fill prescription for astelin nasal spray. Use 2 sprays each nostril 2 to 3 times a day.  Follow with Dr Delton Coombes if your cough does not resolve >> will pursue PFT and possibly FOB at that time if cough

## 2012-11-24 ENCOUNTER — Other Ambulatory Visit: Payer: Self-pay | Admitting: Neurosurgery

## 2012-12-01 ENCOUNTER — Encounter: Payer: Self-pay | Admitting: Cardiovascular Disease

## 2012-12-01 ENCOUNTER — Encounter (INDEPENDENT_AMBULATORY_CARE_PROVIDER_SITE_OTHER): Payer: Self-pay

## 2012-12-01 ENCOUNTER — Ambulatory Visit (INDEPENDENT_AMBULATORY_CARE_PROVIDER_SITE_OTHER): Payer: Medicare Other | Admitting: Cardiovascular Disease

## 2012-12-01 VITALS — BP 116/64 | HR 89 | Ht 71.0 in | Wt 155.0 lb

## 2012-12-01 DIAGNOSIS — I951 Orthostatic hypotension: Secondary | ICD-10-CM

## 2012-12-01 DIAGNOSIS — Z01810 Encounter for preprocedural cardiovascular examination: Secondary | ICD-10-CM

## 2012-12-01 DIAGNOSIS — I2510 Atherosclerotic heart disease of native coronary artery without angina pectoris: Secondary | ICD-10-CM

## 2012-12-01 MED ORDER — FLUDROCORTISONE ACETATE 0.1 MG PO TABS: 0.1000 mg | ORAL_TABLET | Freq: Every day | ORAL | Status: DC

## 2012-12-01 NOTE — Assessment & Plan Note (Signed)
I advised the patient to increase sodium and fluid intake. I will also start him on small dose of Florinef 0.1 mg once daily.

## 2012-12-01 NOTE — Assessment & Plan Note (Signed)
Unfortunately, the patient reports worsening symptoms of chest pain as well as symptomatic hypotension. Thus, I recommend further cardiac evaluation and stabilizing his hypotension before proceeding with back surgery which is expected lasts at least 4 hours. I recommend a treadmill nuclear stress test given his established history of coronary artery disease. I will followup on the patient again in few weeks. We will notify the surgeon's office of the need for further cardiac workup.

## 2012-12-01 NOTE — Progress Notes (Signed)
HPI  This is a 79 year old male who is here today for preoperative cardiovascular evaluation before back fusion surgery which is scheduled for tomorrow.  He has history of mild to moderate coronary artery disease with recurrent chest pain and suspected endothelial dysfunction. Most recent cardiac catheterization was in 2011. Over the last year he had 12-14 pound weight loss of unexplained reasons. He underwent extensive workup which has been unremarkable. He has been suffering from episodes of symptomatic hypotension. He is no longer on metoprolol for that reason. About one week ago, he had one episode of substernal chest tightness which required taking 2 nitroglycerin. Next morning, he had a syncopal episode without physical injuries and was noted to have a blood pressure of 87/40. He has been having frequent blood pressure readings below 100.   Allergies  Allergen Reactions  . Neosporin [Neomycin-Polymyxin-Gramicidin]   . Imdur [Isosorbide Mononitrate]     Headache     Current Outpatient Prescriptions on File Prior to Visit  Medication Sig Dispense Refill  . aspirin 81 MG tablet Take 81 mg by mouth daily.        . clonazePAM (KLONOPIN) 0.5 MG tablet Take 0.5 mg by mouth as needed.        . fluticasone (FLONASE) 50 MCG/ACT nasal spray Place 2 sprays into the nose daily.        Marland Kitchen HYDROcodone-acetaminophen (VICODIN ES) 7.5-750 MG per tablet Take 1 tablet by mouth every 6 (six) hours as needed.        Marland Kitchen levothyroxine (SYNTHROID, LEVOTHROID) 100 MCG tablet Take 100 mcg by mouth daily.        . Multiple Vitamin (MULTIVITAMIN) tablet Take 1 tablet by mouth daily.        . nitroGLYCERIN (NITROSTAT) 0.4 MG SL tablet Place 0.4 mg under the tongue every 5 (five) minutes as needed.        . simvastatin (ZOCOR) 80 MG tablet Take 80 mg by mouth at bedtime.        Marland Kitchen omeprazole (PRILOSEC) 20 MG capsule Take 1 capsule (20 mg total) by mouth daily.  30 capsule  11   No current facility-administered  medications on file prior to visit.     Past Medical History  Diagnosis Date  . BPH (benign prostatic hypertrophy)   . Anxiety   . Hypothyroidism   . CAD (coronary artery disease)   . HLD (hyperlipidemia)   . HTN (hypertension)      Past Surgical History  Procedure Laterality Date  . Rotator cuff repair  1988    Right  . Back surgery  2007  . Cardiac catheterization  11/2009    Had 3 previous cath. Most recent was in 2011. LAD: 40% mid stenosis, D1: 50 ostial , RCA: 20-30%. Normal EF     Family History  Problem Relation Age of Onset  . Cancer Mother     breast  . Heart disease    . Heart attack    . Asthma Father      History   Social History  . Marital Status: Married    Spouse Name: N/A    Number of Children: 2  . Years of Education: N/A   Occupational History  . Retired Biomedical engineer   Social History Main Topics  . Smoking status: Never Smoker   . Smokeless tobacco: Never Used  . Alcohol Use: 1.8 oz/week    3 Cans of beer per week  . Drug Use: No  . Sexual Activity: Not  on file   Other Topics Concern  . Not on file   Social History Narrative  . No narrative on file     PHYSICAL EXAM   BP 116/64  Pulse 89  Ht 5\' 11"  (1.803 m)  Wt 155 lb (70.308 kg)  BMI 21.63 kg/m2 Constitutional: He is oriented to person, place, and time. He appears well-developed and well-nourished. No distress.  HENT: No nasal discharge.  Head: Normocephalic and atraumatic.  Eyes: Pupils are equal and round.  No discharge. Neck: Normal range of motion. Neck supple. No JVD present. No thyromegaly present.  Cardiovascular: Normal rate, regular rhythm, normal heart sounds. Exam reveals no gallop and no friction rub. No murmur heard.  Pulmonary/Chest: Effort normal and breath sounds normal. No stridor. No respiratory distress. He has no wheezes. He has no rales. He exhibits no tenderness.  Abdominal: Soft. Bowel sounds are normal. He exhibits no distension. There is no tenderness.  There is no rebound and no guarding.  Musculoskeletal: Normal range of motion. He exhibits no edema and no tenderness.  Neurological: He is alert and oriented to person, place, and time. Coordination normal.  Skin: Skin is warm and dry. No rash noted. He is not diaphoretic. No erythema. No pallor.  Psychiatric: He has a normal mood and affect. His behavior is normal. Judgment and thought content normal.       EKG: Normal sinus rhythm with no significant ST or T wave changes.   ASSESSMENT AND PLAN

## 2012-12-01 NOTE — Patient Instructions (Addendum)
Your physician has requested that you have an exercise stress myoview. For further information please visit [email or web address] Please follow instruction sheet, as given.  Your physician recommends that you schedule a follow-up appointment in: 2 WEEKS with Dr Kirke Corin  Your physician has recommended you make the following change in your medication: START Fludrocortisone (Florinef) 0.1mg  take one by mouth daily  You are not cleared for surgery at this time from a cardiac standpoint.

## 2012-12-02 ENCOUNTER — Encounter (HOSPITAL_COMMUNITY): Admission: RE | Payer: Self-pay | Source: Ambulatory Visit

## 2012-12-02 ENCOUNTER — Inpatient Hospital Stay (HOSPITAL_COMMUNITY): Admission: RE | Admit: 2012-12-02 | Payer: Medicare Other | Source: Ambulatory Visit | Admitting: Neurosurgery

## 2012-12-02 SURGERY — POSTERIOR LUMBAR FUSION 2 LEVEL
Anesthesia: General | Site: Back

## 2012-12-10 ENCOUNTER — Ambulatory Visit (HOSPITAL_COMMUNITY): Payer: Medicare Other | Attending: Cardiology | Admitting: Radiology

## 2012-12-10 VITALS — BP 142/80 | HR 60 | Ht 71.0 in | Wt 156.0 lb

## 2012-12-10 DIAGNOSIS — Z01810 Encounter for preprocedural cardiovascular examination: Secondary | ICD-10-CM | POA: Insufficient documentation

## 2012-12-10 DIAGNOSIS — R079 Chest pain, unspecified: Secondary | ICD-10-CM

## 2012-12-10 DIAGNOSIS — I4949 Other premature depolarization: Secondary | ICD-10-CM

## 2012-12-10 DIAGNOSIS — R55 Syncope and collapse: Secondary | ICD-10-CM

## 2012-12-10 DIAGNOSIS — I2510 Atherosclerotic heart disease of native coronary artery without angina pectoris: Secondary | ICD-10-CM

## 2012-12-10 MED ORDER — TECHNETIUM TC 99M SESTAMIBI GENERIC - CARDIOLITE
11.0000 | Freq: Once | INTRAVENOUS | Status: AC | PRN
  Administered 2012-12-10: 11 via INTRAVENOUS

## 2012-12-10 MED ORDER — TECHNETIUM TC 99M SESTAMIBI GENERIC - CARDIOLITE
33.0000 | Freq: Once | INTRAVENOUS | Status: AC | PRN
  Administered 2012-12-10: 33 via INTRAVENOUS

## 2012-12-10 NOTE — Progress Notes (Signed)
[facility] SITE 3 NUCLEAR MED [address] [place], [place] [address] [phone]    Cardiology Nuclear Med Study  Justin [name] is a 79 y.o. male     MRN : [id number]     DOB: 03/14/1933  Procedure Date: 12/10/2012  Nuclear Med Background Indication for Stress Test:  Evaluation for Ischemia and Surgical Clearance for Pending Back Surgery by Dr. Shirlean Kelly History:  CAD; '09 [id number] Cardiac Risk Factors: Family History - CAD, Hypertension and Lipids  Symptoms:  Chest Tightness with Exertion (last date of chest discomfort was about 3-weeks ago), Fatigue, Rapid HR and Syncope (last episode of syncope was about 2-weeks ago)   Nuclear Pre-Procedure Caffeine/Decaff Intake:  None NPO After: 7:00am   Lungs:  clear O2 Sat: 98% on room air. IV 0.9% NS with Angio Cath:  22g  IV Site: R Wrist  IV Started by:  Bonnita Levan, RN  Chest Size (in):  42 Cup Size: n/a  Height: 5\' 11"  (1.803 m)  Weight:  156 lb (70.761 kg)  BMI:  Body mass index is 21.77 kg/(m^2). Tech Comments:  N/A    Nuclear Med Study 1 or 2 day study: 1 day  Stress Test Type:  Stress  Reading MD: Marca Ancona, MD  Order Authorizing Provider:  Lorine Bears, MD  Resting Radionuclide: Technetium 67m Sestamibi  Resting Radionuclide Dose: 11.0 mCi   Stress Radionuclide:  Technetium 43m Sestamibi  Stress Radionuclide Dose: 33.0 mCi           Stress Protocol Rest HR: 60 Stress HR: 130  Rest BP: 142/80 Stress BP: 163/61  Exercise Time (min): 9:00 METS: 10.1   Predicted Max HR: 143 bpm % Max HR: 90.91 bpm Rate Pressure Product: 46962   Dose of Adenosine (mg):  n/a Dose of Lexiscan: n/a mg  Dose of Atropine (mg): n/a Dose of Dobutamine: n/a mcg/kg/min (at max HR)  Stress Test Technologist: Smiley Houseman, CMA-N  Nuclear Technologist:  Doyne Keel, CNMT     Rest Procedure:  Myocardial perfusion imaging was performed at rest 45 minutes following the intravenous administration of Technetium 47m  Sestamibi.  Rest ECG: NSR - Normal EKG  Stress Procedure:  The patient exercised on the treadmill utilizing the Bruce Protocol for nine minutes. The patient stopped due to fatigue and denied any significant chest pain.  He did c/o mild chest tightness with dyspnea at peak exercise.  Occasional PVC's were noted.  Technetium 62m Sestamibi was injected at peak exercise and myocardial perfusion imaging was performed after a brief delay.  Stress ECG: No significant change from baseline ECG  QPS Raw Data Images:  Normal; no motion artifact; normal heart/lung ratio. Stress Images:  Small, mild basal inferior perfusion defect. Rest Images:  Small, mild basal inferior perfusion defect. Subtraction (SDS):  Fixed small, mild basal inferior perfusion defect.  Transient Ischemic Dilatation (Normal <1.22):  0.97 Lung/Heart Ratio (Normal <0.45):  0.29  Quantitative Gated Spect Images QGS EDV:  122 ml QGS ESV:  57 ml  Impression Exercise Capacity:  Good exercise capacity. BP Response:  Normal blood pressure response. Clinical Symptoms:  Dyspnea, mild chest tightness.  ECG Impression:  No significant ST segment change suggestive of ischemia. Comparison with Prior Nuclear Study: No images to compare  Overall Impression:  Low risk stress nuclear study with small, mild fixed basal inferior perfusion defect. Given normal wall motion, suspect this is diaphragmatic attenuation.  No ischemia. .  LV Ejection Fraction: 54%.  LV Wall Motion:  NL LV Function; NL Wall Motion  Marca Ancona 12/10/2012

## 2012-12-22 ENCOUNTER — Ambulatory Visit (INDEPENDENT_AMBULATORY_CARE_PROVIDER_SITE_OTHER): Payer: Medicare Other | Admitting: Cardiovascular Disease

## 2012-12-22 ENCOUNTER — Encounter: Payer: Self-pay | Admitting: Cardiovascular Disease

## 2012-12-22 VITALS — BP 132/62 | HR 76 | Resp 12 | Ht 71.0 in | Wt 158.0 lb

## 2012-12-22 DIAGNOSIS — Z01810 Encounter for preprocedural cardiovascular examination: Secondary | ICD-10-CM

## 2012-12-22 DIAGNOSIS — I2510 Atherosclerotic heart disease of native coronary artery without angina pectoris: Secondary | ICD-10-CM

## 2012-12-22 DIAGNOSIS — I951 Orthostatic hypotension: Secondary | ICD-10-CM

## 2012-12-22 MED ORDER — FLUDROCORTISONE ACETATE 0.1 MG PO TABS: ORAL_TABLET | ORAL | Status: DC

## 2012-12-22 NOTE — Assessment & Plan Note (Signed)
This resolved after increasing fluid and salt intake. I asked him to use Florinef only as needed if systolic blood pressure is less than 100.

## 2012-12-22 NOTE — Progress Notes (Signed)
HPI  This is a 79 year old male who is here today for a follow up visit. He was seen recently for preoperative cardiovascular evaluation before back fusion surgery .  He has history of mild to moderate coronary artery disease with recurrent chest pain and suspected endothelial dysfunction. Most recent cardiac catheterization was in 2011. Over the last year he had 12-14 pound weight loss of unexplained reasons. He underwent extensive workup which has been unremarkable. He has been suffering from episodes of symptomatic hypotension.Metoprolol was stopped. He reported a recent  episode of substernal chest tightness which required taking 2 nitroglycerin. Next morning, he had a syncopal episode without physical injuries and was noted to have a blood pressure of 87/40. Thus, I scheduled him for a treadmill Myoview which showed no evidence of ischemia with normal EF.  During last visit, I asked him to increase fluid and salt intake and started small dose Florinef. He reports no further chest pain. He started drinking Gatorade. Blood pressure increased significantly when he took Florinef. Thus, he has not been using it regularly.  Allergies  Allergen Reactions  . Neosporin [Neomycin-Polymyxin-Gramicidin]   . Imdur [Isosorbide Mononitrate]     Headache     Current Outpatient Prescriptions on File Prior to Visit  Medication Sig Dispense Refill  . aspirin 81 MG tablet Take 81 mg by mouth daily.        . clonazePAM (KLONOPIN) 0.5 MG tablet Take 0.5 mg by mouth as needed.        . fluticasone (FLONASE) 50 MCG/ACT nasal spray Place 2 sprays into the nose daily.        Marland Kitchen HYDROcodone-acetaminophen (VICODIN ES) 7.5-750 MG per tablet Take 1 tablet by mouth every 6 (six) hours as needed.        Marland Kitchen levothyroxine (SYNTHROID, LEVOTHROID) 100 MCG tablet Take 100 mcg by mouth daily.        . Multiple Vitamin (MULTIVITAMIN) tablet Take 1 tablet by mouth daily.        . nitroGLYCERIN (NITROSTAT) 0.4 MG SL tablet Place  0.4 mg under the tongue every 5 (five) minutes as needed.        . simvastatin (ZOCOR) 80 MG tablet Take 80 mg by mouth at bedtime.         No current facility-administered medications on file prior to visit.     Past Medical History  Diagnosis Date  . BPH (benign prostatic hypertrophy)   . Anxiety   . Hypothyroidism   . CAD (coronary artery disease)   . HLD (hyperlipidemia)   . HTN (hypertension)      Past Surgical History  Procedure Laterality Date  . Rotator cuff repair  1988    Right  . Back surgery  2007  . Cardiac catheterization  11/2009    Had 3 previous cath. Most recent was in 2011. LAD: 40% mid stenosis, D1: 50 ostial , RCA: 20-30%. Normal EF     Family History  Problem Relation Age of Onset  . Cancer Mother     breast  . Heart disease    . Heart attack    . Asthma Father      History   Social History  . Marital Status: Married    Spouse Name: N/A    Number of Children: 2  . Years of Education: N/A   Occupational History  . Retired Biomedical engineer   Social History Main Topics  . Smoking status: Never Smoker   . Smokeless tobacco: Never Used  .  Alcohol Use: 1.8 oz/week    3 Cans of beer per week  . Drug Use: No  . Sexual Activity: Not on file   Other Topics Concern  . Not on file   Social History Narrative  . No narrative on file     PHYSICAL EXAM   BP 132/62  Pulse 76  Resp 12  Ht 5\' 11"  (1.803 m)  Wt 158 lb (71.668 kg)  BMI 22.05 kg/m2 Constitutional: He is oriented to person, place, and time. He appears well-developed and well-nourished. No distress.  HENT: No nasal discharge.  Head: Normocephalic and atraumatic.  Eyes: Pupils are equal and round.  No discharge. Neck: Normal range of motion. Neck supple. No JVD present. No thyromegaly present.  Cardiovascular: Normal rate, regular rhythm, normal heart sounds. Exam reveals no gallop and no friction rub. No murmur heard.  Pulmonary/Chest: Effort normal and breath sounds normal. No  stridor. No respiratory distress. He has no wheezes. He has no rales. He exhibits no tenderness.  Abdominal: Soft. Bowel sounds are normal. He exhibits no distension. There is no tenderness. There is no rebound and no guarding.  Musculoskeletal: Normal range of motion. He exhibits no edema and no tenderness.  Neurological: He is alert and oriented to person, place, and time. Coordination normal.  Skin: Skin is warm and dry. No rash noted. He is not diaphoretic. No erythema. No pallor.  Psychiatric: He has a normal mood and affect. His behavior is normal. Judgment and thought content normal.        ASSESSMENT AND PLAN

## 2012-12-22 NOTE — Assessment & Plan Note (Signed)
Recent nuclear stress test was normal. Hypotension resolved. The patient can proceed with the planned back surgery with an overall low risk from a cardiac standpoint. Aspirin can be stopped 5-7 days before surgery if desired.

## 2012-12-22 NOTE — Patient Instructions (Signed)
Your physician has recommended you make the following change in your medication:  DECREASE FLORINEF TO AS NEEDED FOR SYSTOLIC BLOOD PRESSURE BELOW 010 MM HG  Your physician wants you to follow-up in: 6 MONTHS  You will receive a reminder letter in the mail two months in advance. If you don't receive a letter, please call our office to schedule the follow-up appointment.

## 2012-12-24 ENCOUNTER — Other Ambulatory Visit: Payer: Self-pay | Admitting: Neurosurgery

## 2013-01-13 ENCOUNTER — Encounter (HOSPITAL_COMMUNITY): Payer: Self-pay | Admitting: Pharmacy Technician

## 2013-01-14 ENCOUNTER — Encounter (HOSPITAL_COMMUNITY)
Admission: RE | Admit: 2013-01-14 | Discharge: 2013-01-14 | Disposition: A | Discharge status: Home / Self Care | Payer: Medicare Other | Source: Ambulatory Visit | Attending: Neurosurgery | Admitting: Neurosurgery

## 2013-01-14 ENCOUNTER — Ambulatory Visit (HOSPITAL_COMMUNITY)
Admission: RE | Admit: 2013-01-14 | Discharge: 2013-01-14 | Disposition: A | Discharge status: Home / Self Care | Payer: Medicare Other | Source: Ambulatory Visit | Attending: Anesthesiology | Admitting: Anesthesiology

## 2013-01-14 ENCOUNTER — Encounter (HOSPITAL_COMMUNITY): Payer: Self-pay

## 2013-01-14 DIAGNOSIS — Z01818 Encounter for other preprocedural examination: Secondary | ICD-10-CM | POA: Insufficient documentation

## 2013-01-14 DIAGNOSIS — M5126 Other intervertebral disc displacement, lumbar region: Secondary | ICD-10-CM | POA: Insufficient documentation

## 2013-01-14 DIAGNOSIS — Z01812 Encounter for preprocedural laboratory examination: Secondary | ICD-10-CM | POA: Insufficient documentation

## 2013-01-14 DIAGNOSIS — Z0183 Encounter for blood typing: Secondary | ICD-10-CM | POA: Insufficient documentation

## 2013-01-14 HISTORY — DX: Unspecified cataract: H26.9

## 2013-01-14 HISTORY — DX: Pain in unspecified joint: M25.50

## 2013-01-14 HISTORY — DX: Personal history of colonic polyps: Z86.010

## 2013-01-14 HISTORY — DX: Personal history of colon polyps, unspecified: Z86.0100

## 2013-01-14 HISTORY — DX: Other chronic pain: G89.29

## 2013-01-14 HISTORY — DX: Dorsalgia, unspecified: M54.9

## 2013-01-14 HISTORY — DX: Insomnia, unspecified: G47.00

## 2013-01-14 HISTORY — DX: Benign prostatic hyperplasia without lower urinary tract symptoms: N40.0

## 2013-01-14 HISTORY — DX: Unspecified osteoarthritis, unspecified site: M19.90

## 2013-01-14 HISTORY — DX: Hypotension, unspecified: I95.9

## 2013-01-14 LAB — BASIC METABOLIC PANEL
CO2: 27 mEq/L (ref 19–32)
Glucose, Bld: 82 mg/dL (ref 70–99)
Potassium: 4.7 mEq/L (ref 3.5–5.1)
Sodium: 138 mEq/L (ref 135–145)

## 2013-01-14 LAB — SURGICAL PCR SCREEN
MRSA, PCR: NEGATIVE
Staphylococcus aureus: POSITIVE — AB

## 2013-01-14 LAB — TYPE AND SCREEN: ABO/RH(D): O POS

## 2013-01-14 LAB — CBC
Hemoglobin: 13.5 g/dL (ref 13.0–17.0)
MCH: 33 pg (ref 26.0–34.0)
MCV: 95.8 fL (ref 78.0–100.0)
Platelets: 151 10*3/uL (ref 150–400)
RBC: 4.09 MIL/uL — ABNORMAL LOW (ref 4.22–5.81)
WBC: 9.6 10*3/uL (ref 4.0–10.5)

## 2013-01-14 LAB — ABO/RH: ABO/RH(D): O POS

## 2013-01-14 NOTE — Progress Notes (Addendum)
Cardiologist is Dr.Arida with last visit in epic from 12-22-12  Denies ever having an echo  Dr.Lee with [facility] is medical md   Stress test reports in epic from 2009 and 2014  Heart cath in epic from 2011  EKG in epic from 12-01-12  Denies CXR in past yr

## 2013-01-14 NOTE — Pre-Procedure Instructions (Signed)
JOVAN COLLIGAN  01/14/2013   Your procedure is scheduled on:  Thurs, Dec 11 @ 10:10 AM  Report to [facility] A at 7:15 AM.  Call this number if you have problems the morning of surgery: [phone]   Remember:   Do not eat food or drink liquids after midnight.   Take these medicines the morning of surgery with A SIP OF WATER: Klonopin(Clonazepam),Flonase(Fluticasone),Pain Pill(if needed),and Levothyroxine(Synthroid)              Stop taking your Aspirin. No Goody's,BC's,Aleve,Ibuprofen,Fish Oil,or any Herbal Medications   Do not wear jewelry.  Do not wear lotions, powders, or colognes. You may wear deodorant.  Men may shave face and neck.  Do not bring valuables to the hospital.  [facility] is not responsible                  for any belongings or valuables.               Contacts, dentures or bridgework may not be worn into surgery.  Leave suitcase in the car. After surgery it may be brought to your room.  For patients admitted to the hospital, discharge time is determined by your                treatment team.               Patients discharged the day of surgery will not be allowed to drive  home.    Special Instructions: Shower using CHG 2 nights before surgery and the night before surgery.  If you shower the day of surgery use CHG.  Use special wash - you have one bottle of CHG for all showers.  You should use approximately 1/3 of the bottle for each shower.   Please read over the following fact sheets that you were given: Pain Booklet, Coughing and Deep Breathing, Blood Transfusion Information, MRSA Information and Surgical Site Infection Prevention

## 2013-01-20 MED ORDER — CEFAZOLIN SODIUM-DEXTROSE 2-3 GM-% IV SOLR: 2.0000 g | INTRAVENOUS | Status: DC

## 2013-01-21 ENCOUNTER — Inpatient Hospital Stay (HOSPITAL_COMMUNITY): Payer: Medicare Other | Admitting: Anesthesiology

## 2013-01-21 ENCOUNTER — Encounter (HOSPITAL_COMMUNITY): Admission: RE | Payer: Self-pay | Source: Ambulatory Visit

## 2013-01-21 ENCOUNTER — Encounter (HOSPITAL_COMMUNITY)
Admission: AD | Disposition: A | Discharge status: Home / Self Care | Payer: Medicare Other | Source: Ambulatory Visit | Attending: Neurosurgery

## 2013-01-21 ENCOUNTER — Encounter (HOSPITAL_COMMUNITY): Payer: Medicare Other | Admitting: Anesthesiology

## 2013-01-21 ENCOUNTER — Other Ambulatory Visit: Payer: Self-pay | Admitting: Neurosurgery

## 2013-01-21 ENCOUNTER — Encounter (HOSPITAL_COMMUNITY): Payer: Self-pay | Admitting: *Deleted

## 2013-01-21 ENCOUNTER — Inpatient Hospital Stay (HOSPITAL_COMMUNITY): Payer: Medicare Other

## 2013-01-21 ENCOUNTER — Inpatient Hospital Stay (HOSPITAL_COMMUNITY): Admission: RE | Admit: 2013-01-21 | Payer: Medicare Other | Source: Ambulatory Visit | Admitting: Neurosurgery

## 2013-01-21 ENCOUNTER — Inpatient Hospital Stay (HOSPITAL_COMMUNITY)
Admission: AD | Admit: 2013-01-21 | Discharge: 2013-01-28 | DRG: 460 | Disposition: A | Discharge status: Home / Self Care | Payer: Medicare Other | Source: Ambulatory Visit | Attending: Neurosurgery | Admitting: Neurosurgery

## 2013-01-21 DIAGNOSIS — M5137 Other intervertebral disc degeneration, lumbosacral region: Secondary | ICD-10-CM | POA: Diagnosis present

## 2013-01-21 DIAGNOSIS — E039 Hypothyroidism, unspecified: Secondary | ICD-10-CM | POA: Diagnosis present

## 2013-01-21 DIAGNOSIS — M51379 Other intervertebral disc degeneration, lumbosacral region without mention of lumbar back pain or lower extremity pain: Secondary | ICD-10-CM | POA: Diagnosis present

## 2013-01-21 DIAGNOSIS — R05 Cough: Secondary | ICD-10-CM | POA: Diagnosis present

## 2013-01-21 DIAGNOSIS — M4180 Other forms of scoliosis, site unspecified: Secondary | ICD-10-CM | POA: Diagnosis present

## 2013-01-21 DIAGNOSIS — F99 Mental disorder, not otherwise specified: Secondary | ICD-10-CM | POA: Diagnosis present

## 2013-01-21 DIAGNOSIS — I10 Essential (primary) hypertension: Secondary | ICD-10-CM | POA: Diagnosis present

## 2013-01-21 DIAGNOSIS — M47817 Spondylosis without myelopathy or radiculopathy, lumbosacral region: Secondary | ICD-10-CM | POA: Diagnosis present

## 2013-01-21 DIAGNOSIS — E785 Hyperlipidemia, unspecified: Secondary | ICD-10-CM | POA: Diagnosis present

## 2013-01-21 DIAGNOSIS — Z7982 Long term (current) use of aspirin: Secondary | ICD-10-CM

## 2013-01-21 DIAGNOSIS — R059 Cough, unspecified: Secondary | ICD-10-CM | POA: Diagnosis present

## 2013-01-21 DIAGNOSIS — F411 Generalized anxiety disorder: Secondary | ICD-10-CM | POA: Diagnosis present

## 2013-01-21 DIAGNOSIS — Z79899 Other long term (current) drug therapy: Secondary | ICD-10-CM

## 2013-01-21 DIAGNOSIS — G8929 Other chronic pain: Secondary | ICD-10-CM | POA: Diagnosis present

## 2013-01-21 DIAGNOSIS — M5126 Other intervertebral disc displacement, lumbar region: Principal | ICD-10-CM | POA: Diagnosis present

## 2013-01-21 DIAGNOSIS — G4700 Insomnia, unspecified: Secondary | ICD-10-CM | POA: Diagnosis present

## 2013-01-21 DIAGNOSIS — N400 Enlarged prostate without lower urinary tract symptoms: Secondary | ICD-10-CM | POA: Diagnosis present

## 2013-01-21 SURGERY — POSTERIOR LUMBAR FUSION 2 LEVEL
Anesthesia: General | Site: Back

## 2013-01-21 MED ORDER — SODIUM CHLORIDE 0.9 % IR SOLN
Status: DC | PRN
  Administered 2013-01-21: 17:00:00

## 2013-01-21 MED ORDER — SUFENTANIL CITRATE 50 MCG/ML IV SOLN
INTRAVENOUS | Status: DC | PRN
  Administered 2013-01-21 (×4): 5 ug via INTRAVENOUS
  Administered 2013-01-21: 10 ug via INTRAVENOUS
  Administered 2013-01-21: 5 ug via INTRAVENOUS
  Administered 2013-01-21: 15 ug via INTRAVENOUS

## 2013-01-21 MED ORDER — ROCURONIUM BROMIDE 100 MG/10ML IV SOLN
INTRAVENOUS | Status: DC | PRN
  Administered 2013-01-21 (×4): 10 mg via INTRAVENOUS
  Administered 2013-01-21: 50 mg via INTRAVENOUS
  Administered 2013-01-21: 10 mg via INTRAVENOUS

## 2013-01-21 MED ORDER — ONDANSETRON HCL 4 MG/2ML IJ SOLN: 4.0000 mg | Freq: Four times a day (QID) | INTRAMUSCULAR | Status: DC | PRN

## 2013-01-21 MED ORDER — ALUM & MAG HYDROXIDE-SIMETH 200-200-20 MG/5ML PO SUSP: 30.0000 mL | Freq: Four times a day (QID) | ORAL | Status: DC | PRN

## 2013-01-21 MED ORDER — SODIUM CHLORIDE 0.9 % IJ SOLN
3.0000 mL | Freq: Two times a day (BID) | INTRAMUSCULAR | Status: DC
  Administered 2013-01-21 – 2013-01-27 (×11): 3 mL via INTRAVENOUS

## 2013-01-21 MED ORDER — GLYCOPYRROLATE 0.2 MG/ML IJ SOLN
INTRAMUSCULAR | Status: DC | PRN
  Administered 2013-01-21: .5 mg via INTRAVENOUS

## 2013-01-21 MED ORDER — DEXTROSE-NACL 5-0.45 % IV SOLN: INTRAVENOUS | Status: DC

## 2013-01-21 MED ORDER — HYDROMORPHONE HCL PF 1 MG/ML IJ SOLN
0.2500 mg | INTRAMUSCULAR | Status: DC | PRN
  Administered 2013-01-21 (×2): 0.5 mg via INTRAVENOUS

## 2013-01-21 MED ORDER — NITROGLYCERIN 0.4 MG SL SUBL
0.4000 mg | SUBLINGUAL_TABLET | SUBLINGUAL | Status: DC | PRN
  Filled 2013-01-21: qty 25

## 2013-01-21 MED ORDER — OXYCODONE HCL 5 MG PO TABS: 5.0000 mg | ORAL_TABLET | Freq: Once | ORAL | Status: DC | PRN

## 2013-01-21 MED ORDER — PROMETHAZINE HCL 25 MG/ML IJ SOLN
INTRAMUSCULAR | Status: AC
  Filled 2013-01-21: qty 1

## 2013-01-21 MED ORDER — MAGNESIUM HYDROXIDE 400 MG/5ML PO SUSP
30.0000 mL | Freq: Every day | ORAL | Status: DC | PRN
  Administered 2013-01-26: 30 mL via ORAL
  Filled 2013-01-21: qty 30

## 2013-01-21 MED ORDER — LACTATED RINGERS IV SOLN
INTRAVENOUS | Status: DC | PRN
  Administered 2013-01-21 (×3): via INTRAVENOUS

## 2013-01-21 MED ORDER — MORPHINE SULFATE 4 MG/ML IJ SOLN
4.0000 mg | INTRAMUSCULAR | Status: DC | PRN
  Administered 2013-01-22 – 2013-01-23 (×2): 4 mg via INTRAMUSCULAR
  Filled 2013-01-21 (×2): qty 1

## 2013-01-21 MED ORDER — PROMETHAZINE HCL 25 MG/ML IJ SOLN
6.2500 mg | INTRAMUSCULAR | Status: DC | PRN
  Administered 2013-01-21: 6.25 mg via INTRAVENOUS

## 2013-01-21 MED ORDER — ATORVASTATIN CALCIUM 40 MG PO TABS
40.0000 mg | ORAL_TABLET | Freq: Every day | ORAL | Status: DC
  Administered 2013-01-22 – 2013-01-27 (×6): 40 mg via ORAL
  Filled 2013-01-21 (×8): qty 1

## 2013-01-21 MED ORDER — KETOROLAC TROMETHAMINE 30 MG/ML IJ SOLN
30.0000 mg | Freq: Four times a day (QID) | INTRAMUSCULAR | Status: AC
  Administered 2013-01-21 – 2013-01-23 (×8): 30 mg via INTRAVENOUS
  Filled 2013-01-21 (×10): qty 1

## 2013-01-21 MED ORDER — CYCLOBENZAPRINE HCL 10 MG PO TABS
10.0000 mg | ORAL_TABLET | Freq: Three times a day (TID) | ORAL | Status: DC | PRN
  Administered 2013-01-24 – 2013-01-28 (×6): 10 mg via ORAL
  Filled 2013-01-21 (×6): qty 1

## 2013-01-21 MED ORDER — LIDOCAINE-EPINEPHRINE 1 %-1:100000 IJ SOLN
INTRAMUSCULAR | Status: DC | PRN
  Administered 2013-01-21: 5 mL via INTRADERMAL

## 2013-01-21 MED ORDER — OXYCODONE-ACETAMINOPHEN 5-325 MG PO TABS
1.0000 | ORAL_TABLET | ORAL | Status: DC | PRN
  Administered 2013-01-22 – 2013-01-23 (×5): 2 via ORAL
  Administered 2013-01-23: 1 via ORAL
  Administered 2013-01-23 – 2013-01-24 (×3): 2 via ORAL
  Administered 2013-01-24: 1 via ORAL
  Administered 2013-01-24: 2 via ORAL
  Administered 2013-01-25: 1 via ORAL
  Filled 2013-01-21 (×8): qty 2
  Filled 2013-01-21: qty 1
  Filled 2013-01-21: qty 2
  Filled 2013-01-21: qty 1
  Filled 2013-01-21: qty 2

## 2013-01-21 MED ORDER — FENTANYL CITRATE 0.05 MG/ML IJ SOLN
INTRAMUSCULAR | Status: DC | PRN
  Administered 2013-01-21 (×3): 50 ug via INTRAVENOUS

## 2013-01-21 MED ORDER — ACETAMINOPHEN 325 MG PO TABS: 650.0000 mg | ORAL_TABLET | ORAL | Status: DC | PRN

## 2013-01-21 MED ORDER — HYDROCODONE-ACETAMINOPHEN 5-325 MG PO TABS
1.0000 | ORAL_TABLET | ORAL | Status: DC | PRN
  Administered 2013-01-25 – 2013-01-26 (×5): 2 via ORAL
  Administered 2013-01-26: 1 via ORAL
  Administered 2013-01-26 (×2): 2 via ORAL
  Administered 2013-01-27 (×2): 1 via ORAL
  Administered 2013-01-27: 2 via ORAL
  Administered 2013-01-27 – 2013-01-28 (×2): 1 via ORAL
  Administered 2013-01-28: 2 via ORAL
  Filled 2013-01-21: qty 1
  Filled 2013-01-21 (×2): qty 2
  Filled 2013-01-21: qty 1
  Filled 2013-01-21 (×2): qty 2
  Filled 2013-01-21: qty 1
  Filled 2013-01-21: qty 2
  Filled 2013-01-21: qty 1
  Filled 2013-01-21 (×5): qty 2

## 2013-01-21 MED ORDER — FLUTICASONE PROPIONATE 50 MCG/ACT NA SUSP
2.0000 | Freq: Every day | NASAL | Status: DC
  Administered 2013-01-24 – 2013-01-28 (×4): 2 via NASAL
  Filled 2013-01-21: qty 16

## 2013-01-21 MED ORDER — SODIUM CHLORIDE 0.9 % IV SOLN: 250.0000 mL | INTRAVENOUS | Status: DC

## 2013-01-21 MED ORDER — MENTHOL 3 MG MT LOZG
1.0000 | LOZENGE | OROMUCOSAL | Status: DC | PRN
  Filled 2013-01-21: qty 9

## 2013-01-21 MED ORDER — CEFAZOLIN SODIUM-DEXTROSE 2-3 GM-% IV SOLR
INTRAVENOUS | Status: DC | PRN
  Administered 2013-01-21 (×2): 2 g via INTRAVENOUS

## 2013-01-21 MED ORDER — LEVOTHYROXINE SODIUM 100 MCG PO TABS
100.0000 ug | ORAL_TABLET | Freq: Every day | ORAL | Status: DC
  Administered 2013-01-22 – 2013-01-28 (×7): 100 ug via ORAL
  Filled 2013-01-21 (×9): qty 1

## 2013-01-21 MED ORDER — HYDROXYZINE HCL 25 MG PO TABS: 50.0000 mg | ORAL_TABLET | ORAL | Status: DC | PRN

## 2013-01-21 MED ORDER — LACTATED RINGERS IV SOLN
INTRAVENOUS | Status: DC
  Administered 2013-01-21: 10:00:00 via INTRAVENOUS

## 2013-01-21 MED ORDER — BISACODYL 10 MG RE SUPP: 10.0000 mg | Freq: Every day | RECTAL | Status: DC | PRN

## 2013-01-21 MED ORDER — BUPIVACAINE HCL (PF) 0.5 % IJ SOLN
INTRAMUSCULAR | Status: DC | PRN
  Administered 2013-01-21: 5 mL

## 2013-01-21 MED ORDER — NEOSTIGMINE METHYLSULFATE 1 MG/ML IJ SOLN
INTRAMUSCULAR | Status: DC | PRN
  Administered 2013-01-21: 3 mg via INTRAVENOUS

## 2013-01-21 MED ORDER — OXYCODONE HCL 5 MG/5ML PO SOLN: 5.0000 mg | Freq: Once | ORAL | Status: DC | PRN

## 2013-01-21 MED ORDER — SODIUM CHLORIDE 0.9 % IJ SOLN: 3.0000 mL | INTRAMUSCULAR | Status: DC | PRN

## 2013-01-21 MED ORDER — FLUDROCORTISONE ACETATE 0.1 MG PO TABS
0.1000 mg | ORAL_TABLET | Freq: Every day | ORAL | Status: DC | PRN
  Filled 2013-01-21 (×3): qty 1

## 2013-01-21 MED ORDER — KETOROLAC TROMETHAMINE 30 MG/ML IJ SOLN
30.0000 mg | Freq: Once | INTRAMUSCULAR | Status: AC
  Administered 2013-01-21: 30 mg via INTRAVENOUS

## 2013-01-21 MED ORDER — CLONAZEPAM 0.5 MG PO TABS
0.5000 mg | ORAL_TABLET | Freq: Every day | ORAL | Status: DC | PRN
  Administered 2013-01-27: 0.5 mg via ORAL
  Filled 2013-01-21: qty 1

## 2013-01-21 MED ORDER — ACETAMINOPHEN 650 MG RE SUPP: 650.0000 mg | RECTAL | Status: DC | PRN

## 2013-01-21 MED ORDER — SODIUM CHLORIDE 0.9 % IV SOLN
INTRAVENOUS | Status: DC | PRN
  Administered 2013-01-21: 20:00:00 via INTRAVENOUS

## 2013-01-21 MED ORDER — LIDOCAINE HCL (CARDIAC) 20 MG/ML IV SOLN
INTRAVENOUS | Status: DC | PRN
  Administered 2013-01-21: 60 mg via INTRAVENOUS

## 2013-01-21 MED ORDER — ZOLPIDEM TARTRATE 5 MG PO TABS: 5.0000 mg | ORAL_TABLET | Freq: Every evening | ORAL | Status: DC | PRN

## 2013-01-21 MED ORDER — KETOROLAC TROMETHAMINE 30 MG/ML IJ SOLN
INTRAMUSCULAR | Status: AC
  Filled 2013-01-21: qty 1

## 2013-01-21 MED ORDER — PHENYLEPHRINE HCL 10 MG/ML IJ SOLN
10.0000 mg | INTRAVENOUS | Status: DC | PRN
  Administered 2013-01-21: 10 ug/min via INTRAVENOUS

## 2013-01-21 MED ORDER — PHENOL 1.4 % MT LIQD: 1.0000 | OROMUCOSAL | Status: DC | PRN

## 2013-01-21 MED ORDER — THROMBIN 20000 UNITS EX SOLR
CUTANEOUS | Status: DC | PRN
  Administered 2013-01-21: 17:00:00 via TOPICAL

## 2013-01-21 MED ORDER — ONDANSETRON HCL 4 MG/2ML IJ SOLN
INTRAMUSCULAR | Status: DC | PRN
  Administered 2013-01-21: 4 mg via INTRAVENOUS

## 2013-01-21 MED ORDER — PROPOFOL 10 MG/ML IV BOLUS
INTRAVENOUS | Status: DC | PRN
  Administered 2013-01-21: 130 mg via INTRAVENOUS

## 2013-01-21 MED ORDER — ACETAMINOPHEN 500 MG PO TABS
500.0000 mg | ORAL_TABLET | Freq: Four times a day (QID) | ORAL | Status: DC | PRN
  Filled 2013-01-21: qty 1

## 2013-01-21 MED ORDER — 0.9 % SODIUM CHLORIDE (POUR BTL) OPTIME
TOPICAL | Status: DC | PRN
  Administered 2013-01-21 (×2): 1000 mL

## 2013-01-21 MED ORDER — HYDROMORPHONE HCL PF 1 MG/ML IJ SOLN
INTRAMUSCULAR | Status: AC
  Filled 2013-01-21: qty 1

## 2013-01-21 SURGICAL SUPPLY — 84 items
ADH SKN CLS APL DERMABOND .7 (GAUZE/BANDAGES/DRESSINGS) ×4
APL SKNCLS STERI-STRIP NONHPOA (GAUZE/BANDAGES/DRESSINGS) ×1
BAG DECANTER FOR FLEXI CONT (MISCELLANEOUS) ×2 IMPLANT
BENZOIN TINCTURE PRP APPL 2/3 (GAUZE/BANDAGES/DRESSINGS) ×1 IMPLANT
BLADE SURG ROTATE 9660 (MISCELLANEOUS) IMPLANT
BRUSH SCRUB EZ PLAIN DRY (MISCELLANEOUS) ×2 IMPLANT
BUR ACRON 5.0MM COATED (BURR) ×3 IMPLANT
BUR MATCHSTICK NEURO 3.0 LAGG (BURR) ×2 IMPLANT
CANISTER SUCT 3000ML (MISCELLANEOUS) ×2 IMPLANT
CAP INCHANGES (Cap) ×4 IMPLANT
CAP LCK SPNE (Orthopedic Implant) ×4 IMPLANT
CAP LOCK SPINE RADIUS (Orthopedic Implant) IMPLANT
CAP LOCKING (Orthopedic Implant) ×8 IMPLANT
CONT SPEC 4OZ CLIKSEAL STRL BL (MISCELLANEOUS) ×4 IMPLANT
COVER BACK TABLE 24X17X13 BIG (DRAPES) IMPLANT
COVER TABLE BACK 60X90 (DRAPES) ×2 IMPLANT
CROSSLINK MEDIUM (Orthopedic Implant) ×1 IMPLANT
DERMABOND ADVANCED (GAUZE/BANDAGES/DRESSINGS) ×4
DERMABOND ADVANCED .7 DNX12 (GAUZE/BANDAGES/DRESSINGS) ×2 IMPLANT
DRAPE C-ARM 42X72 X-RAY (DRAPES) ×4 IMPLANT
DRAPE LAPAROTOMY 100X72X124 (DRAPES) ×2 IMPLANT
DRAPE POUCH INSTRU U-SHP 10X18 (DRAPES) ×2 IMPLANT
DRAPE PROXIMA HALF (DRAPES) IMPLANT
DRSG EMULSION OIL 3X3 NADH (GAUZE/BANDAGES/DRESSINGS) IMPLANT
ELECT REM PT RETURN 9FT ADLT (ELECTROSURGICAL) ×2
ELECTRODE REM PT RTRN 9FT ADLT (ELECTROSURGICAL) ×1 IMPLANT
EVACUATOR 1/8 PVC DRAIN (DRAIN) ×1 IMPLANT
GAUZE SPONGE 4X4 16PLY XRAY LF (GAUZE/BANDAGES/DRESSINGS) IMPLANT
GLOVE BIO SURGEON STRL SZ 6.5 (GLOVE) ×2 IMPLANT
GLOVE BIOGEL PI IND STRL 6.5 (GLOVE) IMPLANT
GLOVE BIOGEL PI IND STRL 8 (GLOVE) ×2 IMPLANT
GLOVE BIOGEL PI INDICATOR 6.5 (GLOVE) ×1
GLOVE BIOGEL PI INDICATOR 8 (GLOVE) ×2
GLOVE ECLIPSE 7.5 STRL STRAW (GLOVE) ×4 IMPLANT
GLOVE EXAM NITRILE LRG STRL (GLOVE) IMPLANT
GLOVE EXAM NITRILE MD LF STRL (GLOVE) IMPLANT
GLOVE EXAM NITRILE XL STR (GLOVE) IMPLANT
GLOVE EXAM NITRILE XS STR PU (GLOVE) IMPLANT
GOWN BRE IMP SLV AUR LG STRL (GOWN DISPOSABLE) ×1 IMPLANT
GOWN BRE IMP SLV AUR XL STRL (GOWN DISPOSABLE) ×4 IMPLANT
GOWN STRL REIN 2XL LVL4 (GOWN DISPOSABLE) IMPLANT
KIT BASIN OR (CUSTOM PROCEDURE TRAY) ×2 IMPLANT
KIT INFUSE MEDIUM (Orthopedic Implant) ×1 IMPLANT
KIT ROOM TURNOVER OR (KITS) ×2 IMPLANT
MILL MEDIUM DISP (BLADE) ×2 IMPLANT
NDL 18GX1X1/2 (RX/OR ONLY) (NEEDLE) ×1 IMPLANT
NDL SPNL 18GX3.5 QUINCKE PK (NEEDLE) ×1 IMPLANT
NDL SPNL 22GX3.5 QUINCKE BK (NEEDLE) ×1 IMPLANT
NEEDLE 18GX1X1/2 (RX/OR ONLY) (NEEDLE) ×2 IMPLANT
NEEDLE BIOPSY JAMSHIDI 10X5 XI (NEEDLE) ×1 IMPLANT
NEEDLE BONE MARROW 8GAX6 (NEEDLE) IMPLANT
NEEDLE SPNL 18GX3.5 QUINCKE PK (NEEDLE) ×2 IMPLANT
NEEDLE SPNL 22GX3.5 QUINCKE BK (NEEDLE) ×2 IMPLANT
NS IRRIG 1000ML POUR BTL (IV SOLUTION) ×3 IMPLANT
PACK LAMINECTOMY NEURO (CUSTOM PROCEDURE TRAY) ×2 IMPLANT
PAD ARMBOARD 7.5X6 YLW CONV (MISCELLANEOUS) ×6 IMPLANT
PATTIES SURGICAL .5 X.5 (GAUZE/BANDAGES/DRESSINGS) IMPLANT
PATTIES SURGICAL .5 X1 (DISPOSABLE) IMPLANT
PATTIES SURGICAL 1X1 (DISPOSABLE) IMPLANT
PEEK PLIF AVS 9X25X4 (Peek) ×4 IMPLANT
ROD 90MM RADIUS (Rod) ×2 IMPLANT
SCREW 5.75X45MM (Screw) ×4 IMPLANT
SPONGE GAUZE 4X4 12PLY (GAUZE/BANDAGES/DRESSINGS) ×2 IMPLANT
SPONGE LAP 4X18 X RAY DECT (DISPOSABLE) IMPLANT
SPONGE NEURO XRAY DETECT 1X3 (DISPOSABLE) IMPLANT
SPONGE SURGIFOAM ABS GEL 100 (HEMOSTASIS) ×1 IMPLANT
STAPLER SKIN PROX WIDE 3.9 (STAPLE) ×1 IMPLANT
STRIP BIOACTIVE VITOSS 25X100X (Neuro Prosthesis/Implant) ×2 IMPLANT
STRIP CLOSURE SKIN 1/2X4 (GAUZE/BANDAGES/DRESSINGS) IMPLANT
SUT ETHILON 3 0 FSL (SUTURE) ×1 IMPLANT
SUT PROLENE 6 0 BV (SUTURE) IMPLANT
SUT VIC AB 1 CT1 18XBRD ANBCTR (SUTURE) ×2 IMPLANT
SUT VIC AB 1 CT1 8-18 (SUTURE) ×4
SUT VIC AB 2-0 CP2 18 (SUTURE) ×5 IMPLANT
SYR 20CC LL (SYRINGE) ×2 IMPLANT
SYR 3ML LL SCALE MARK (SYRINGE) ×8 IMPLANT
SYR 5ML LL (SYRINGE) ×1 IMPLANT
SYR CONTROL 10ML LL (SYRINGE) ×2 IMPLANT
TAPE CLOTH SURG 4X10 WHT LF (GAUZE/BANDAGES/DRESSINGS) ×1 IMPLANT
TOWEL OR 17X24 6PK STRL BLUE (TOWEL DISPOSABLE) ×2 IMPLANT
TOWEL OR 17X26 10 PK STRL BLUE (TOWEL DISPOSABLE) ×2 IMPLANT
TRAP SPECIMEN MUCOUS 40CC (MISCELLANEOUS) ×1 IMPLANT
TRAY FOLEY CATH 14FRSI W/METER (CATHETERS) ×2 IMPLANT
WATER STERILE IRR 1000ML POUR (IV SOLUTION) ×2 IMPLANT

## 2013-01-21 NOTE — H&P (Signed)
Subjective: Patient is a 79 y.o. male who is admitted for treatment of advanced multilevel degenerative changes at the L3-4 and L4-5 levels. He is status post L5-S1 lumbar decompression, PLIF, and PLA in January 2006. Patient is passing or developed pain in the left side of his low back radiating down to the left buttock and anterolateral left thigh. It tends to be aggravated by standing and walking. On examination he was found to have weakness of the left iliopsoas 4/5. X-rays show a well-healed fusion at L5-S1, with lumbarization of S1, with S1-S2 disc. MRI scan shows a very large left L3-4 foraminal and extraforaminal disc herniation with severe compression of the exiting left L3 nerve root. At L4-5 there is marked bilateral hypertrophic facet arthropathy with marked to severe multifactorial lumbar stenosis. Patient is admitted now for L3-L5 decompressive lumbar laminectomy, bilateral L3-4 and L4-5 lumbar decompression including facetectomy, foraminotomy, and discectomy, and bilateral L3-4 and L4-5 posterior lumbar interbody arthrodesis with interbody implants and bone graft, and L3-L5 posterior lateral arthrodesis with posterior instrumentation and bone graft..   Patient Active Problem List   Diagnosis Date Noted  . Preoperative cardiovascular examination 12/01/2012  . Symptomatic hypotension 12/01/2012  . Chronic cough 10/04/2011  . CAD (coronary artery disease)   . HLD (hyperlipidemia)   . HTN (hypertension)    Past Medical History  Diagnosis Date  . BPH (benign prostatic hypertrophy)   . HLD (hyperlipidemia)     takes Simvastatin nightly  . Hypothyroidism     takes Synthroid daily  . Anxiety     takes Klonopin daily as needed  . Arthritis   . Joint pain   . Chronic back pain     stenosis,ddd,and HNP  . History of colon polyps   . Enlarged prostate   . Cataracts, bilateral     immature  . Insomnia     doesn't take any meds  . Hypotension     Past Surgical History  Procedure  Laterality Date  . Rotator cuff repair Right 1988  . Tonsillectomy      as a child  . Back surgery  2005  . Colonoscopy    . Cardiac catheterization  11/2009    Had 3 previous cath. Most recent was in 2011. LAD: 40% mid stenosis, D1: 50 ostial , RCA: 20-30%. Normal EF    Prescriptions prior to admission  Medication Sig Dispense Refill  . acetaminophen (TYLENOL) 500 MG tablet Take 500 mg by mouth every 6 (six) hours as needed for moderate pain.      Marland Kitchen aspirin 81 MG tablet Take 81 mg by mouth daily.        . clonazePAM (KLONOPIN) 0.5 MG tablet Take 0.5 mg by mouth daily as needed for anxiety.       . fludrocortisone (FLORINEF) 0.1 MG tablet Take 0.1 mg by mouth daily as needed (low blood pressure).      . fluticasone (FLONASE) 50 MCG/ACT nasal spray Place 2 sprays into both nostrils daily.       Marland Kitchen HYDROcodone-acetaminophen (NORCO) 7.5-325 MG per tablet Take 1 tablet by mouth every 6 (six) hours as needed for moderate pain.      Marland Kitchen levothyroxine (SYNTHROID, LEVOTHROID) 100 MCG tablet Take 100 mcg by mouth daily.        . Multiple Vitamin (MULTIVITAMIN) tablet Take 1 tablet by mouth daily.        . simvastatin (ZOCOR) 80 MG tablet Take 80 mg by mouth at bedtime.        Marland Kitchen  nitroGLYCERIN (NITROSTAT) 0.4 MG SL tablet Place 0.4 mg under the tongue every 5 (five) minutes as needed.         Allergies  Allergen Reactions  . Adhesive [Tape]     Rash and itching  . Imdur [Isosorbide Mononitrate]     Headache  . Neosporin [Neomycin-Polymyxin-Gramicidin] Rash    History  Substance Use Topics  . Smoking status: Never Smoker   . Smokeless tobacco: Never Used  . Alcohol Use: No    Family History  Problem Relation Age of Onset  . Cancer Mother     breast  . Heart disease    . Heart attack    . Asthma Father      Review of Systems A comprehensive review of systems was negative.  Objective: Vital signs in last 24 hours: Temp:  [98.4 F (36.9 C)] 98.4 F (36.9 C) (12/11 1019) Pulse Rate:   [73] 73 (12/11 1019) Resp:  [18] 18 (12/11 1019) BP: (157)/(84) 157/84 mmHg (12/11 1019) SpO2:  [100 %] 100 % (12/11 1019)  EXAM: Patient is a well-developed well-nourished white male in no acute distress. Lungs are clear to auscultation , the patient has symmetrical respiratory excursion. Heart has a regular rate and rhythm normal S1 and S2 no murmur.   Abdomen is soft nontender nondistended bowel sounds are present. Extremity examination shows no clubbing cyanosis or edema. Musculoskeletal examination shows diffuse tenderness to palpation in the lumbar region, without specific point tenderness. He is able to flex to 90, and able to extend to 10. Straight leg raising is negative bilaterally. Motor examination shows the left iliopsoas is 4/5, the right iliopsoas is 5/5. The remainder the lower extremity strength is 5/5 including the quadriceps, dorsiflexor, EHL, and plantar flexor bilaterally. Sensation is intact to pinprick the distal lower extremities. Reflexes are 1 the quadriceps, and absent the gastrocnemius. They're symmetrical bilaterally. Toes are downgoing bilaterally. He has a normal gait and stance, although they're both limited in duration due to the left lumbar radicular pain.  Data Review:CBC    Component Value Date/Time   WBC 9.6 01/14/2013 1439   RBC 4.09* 01/14/2013 1439   HGB 13.5 01/14/2013 1439   HCT 39.2 01/14/2013 1439   PLT 151 01/14/2013 1439   MCV 95.8 01/14/2013 1439   MCH 33.0 01/14/2013 1439   MCHC 34.4 01/14/2013 1439   RDW 12.2 01/14/2013 1439                          BMET    Component Value Date/Time   NA 138 01/14/2013 1439   K 4.7 01/14/2013 1439   CL 103 01/14/2013 1439   CO2 27 01/14/2013 1439   GLUCOSE 82 01/14/2013 1439   BUN 17 01/14/2013 1439   CREATININE 0.98 01/14/2013 1439   CALCIUM 9.2 01/14/2013 1439   GFRNONAA 77* 01/14/2013 1439   GFRAA 90* 01/14/2013 1439     Assessment/Plan: Patient with a left L3 radiculopathy with significant left iliopsoas  weakness and disabling left lumbar radicular pain secondary to a large left L3-4 foraminal and extraforaminal disc herniation, with significant L3 nerve root compression. There is marked to space collapse on the left side at L3-4. There is also marked to severe multifactorial lumbar stenosis L4-5 with significant facet arthropathy. Patient is admitted now for bilateral L3-4 and L4-5 lumbar decompression and stabilization as described above.  I've discussed with the patient the nature of his condition, the nature the surgical procedure,  the typical length of surgery, hospital stay, and overall recuperation, the limitations postoperatively, and risks of surgery. I discussed risks including risks of infection, bleeding, possibly need for transfusion, the risk of nerve root dysfunction with pain, weakness, numbness, or paresthesias, the risk of dural tear and CSF leakage and possible need for further surgery, the risk of failure of the arthrodesis and possibly for further surgery, the risk of anesthetic complications including myocardial infarction, stroke, pneumonia, and death. We discussed the need for postoperative immobilization in a lumbar brace. Understanding all this the patient does wish to proceed with surgery and is admitted for such.     Hewitt Shorts, MD 01/21/2013 11:57 AM

## 2013-01-21 NOTE — Transfer of Care (Signed)
Immediate Anesthesia Transfer of Care Note  Patient: Justin [name]  Procedure(s) Performed: Procedure(s) with comments: POSTERIOR LUMBAR FUSION 2 LEVEL (N/A) - Lumbar Three-Four Lumbar Four-Five Decompression Posterior Lumbar Interbody Fusion with interbody prosthesis with posterior lateral arthrodesis and posterior segemental instrumentation  Patient Location: PACU  Anesthesia Type:General  Level of Consciousness: awake  Airway & Oxygen Therapy: Patient Spontanous Breathing and Patient connected to face mask oxygen  Post-op Assessment: Report given to PACU RN, Post -op Vital signs reviewed and stable and Patient moving all extremities X 4  Post vital signs: Reviewed and stable  Complications: No apparent anesthesia complications

## 2013-01-21 NOTE — Op Note (Signed)
01/21/2013  8:38 PM  PATIENT:  Justin [name]  79 y.o. male  PRE-OPERATIVE DIAGNOSIS:  Left L34 lumbar herniated disc, lumbar stenosis, lumbar degenerative disc disease, lumbar spondylosis, lumbar degenerative scoliosis  POST-OPERATIVE DIAGNOSIS:  Left L34 lumbar herniated disc, lumbar stenosis, lumbar degenerative disc disease, lumbar spondylosis, lumbar degenerative scoliosis  PROCEDURE:  Procedure(s): POSTERIOR LUMBAR FUSION 2 LEVEL:  L3-L5 decompressive lumbar laminectomy and bilateral L3-4 and L4-5 facetectomy and foraminotomies with decompression of the central canal, lateral recess, and neural foraminal stenosis, with decompression beyond that required for interbody arthrodesis; bilateral L3-4 and L4-5 discectomy, and posterior lumbar interbody arthrodesis with AVS peek interbody implant and Vitoss BA and infuse; bilateral L3-L5 posterior lateral arthrodesis with segmental radius and 90 D. posterior instrumentation, Vitoss BA, locally harvested morcellized autograft, and infuse  SURGEON:  Surgeon(s): Hewitt Shorts, MD Maeola Harman, MD  ASSISTANTS: Maeola Harman, M.D.  ANESTHESIA:   general  EBL:  Total I/O In: 260 [I.V.:100; Blood:160] Out: 265 [Urine:165; Blood:100]  BLOOD ADMINISTERED:160 CC CELLSAVER  COUNT: Correct per nursing staff  DRAINS: Hemovac drain (medium) in laminectomy defect  DICTATION: Patient was brought to the operating room placed under general endotracheal anesthesia. The patient was turned to prone position, the lumbar region was prepped with Betadine soap and solution and draped in a sterile fashion. The midline was infiltrated with local anesthesia with epinephrine. The patient has had a previous L5-S1 lumbar decompression and arthrodesis, and the previous midline incision was used, and extended rostrally. It should be noted that the patient has an S1-S2 disc. The incision was carried down through the subcutaneous tissue, bipolar cautery and  electrocautery were used to maintain hemostasis. Dissection was carried down to the lumbar fascia. The fascia was incised bilaterally and the paraspinal muscles were dissected with a spinous process and lamina in a subperiosteal fashion. An x-ray was taken for localization and the L3-4 and L4-5 levels were localized. Dissection was then carried out laterally over the facet complexes and the transverse processes of L3, L4, and L5 were exposed and decorticated. We also exposed the existing 90 D. posterior instrumentation at the L5-S1 level. The locking caps were unlocked, and removed. We then removed the rods bilaterally. The screw heads were cleaned of soft tissue. We proceeded with an L3-L5 decompressive lumbar laminectomy, using double-action rongeurs, the high-speed drill, and Kerrison punches. Dissection was carried out laterally including facetectomy and foraminotomies with decompression of the spondylitic stenotic compression of the L3, L4, and L5 nerve roots. We proceeded with bilateral discectomies. On the left side at L3-4 there was a substantial disc herniation within the foramen and extraforaminal space that was carefully removed, decompressing the exiting left L3 nerve root. Once the decompression of the stenotic compression of the thecal sac and exiting nerve roots was completed we proceeded with the posterior lumbar interbody arthrodesis. Bilateral L3-4 and L4-5 discectomy was performed. At each level and on each side the annulus was incised and the disc space entered. A thorough discectomy was performed using pituitary rongeurs and curettes. Once the discectomy was completed we began to prepare the endplate surfaces, removing the cartilaginous endplates surface. We then measured the height of the intervertebral disc space. We selected 9 x 25 x 4 AVS peek interbody implants for the L3-4 level, and 9 x 25 x 4 AVS peek interbody implants for the L4-5 level.  The C-arm fluoroscope was then draped and  brought in the field and we identified the pedicle entry points bilaterally at the L3  and L4 levels. Each of the 4 pedicles was probed. Then each of the pedicles was examined with the ball probe, good bony surfaces were found and no bony cuts were found. Each of the pedicles was then tapped with a 5.25 mm tap, again examined with the ball probe good threading was found and no bony cuts were found. We then placed 5.75 by 45 millimeter screws bilaterally at the L3 level and 5.75 by 45 millimeter screws bilaterally at the L4 level. The existing 90 D. screws at L5 and S1 were left in place. The new screws at L3 and L4 were radius screws.  We then packed the AVS peek interbody implants with Vitoss BA and infuse, and then placed the first implant at the L4-5 level on the right side, carefully retracting the thecal sac and nerve root medially. We then went back to the left side and packed the midline with additional Vitoss BA and then placed a second implant on the left side again retracting the thecal sac and nerve root medially. Additional Vitoss BA with bone marrow aspirate was packed lateral to the implants.  Then at the L3-4 level, we placed the first implant on the left side, carefully retracting the thecal sac and nerve root medially. We then went back to the left side and packed the midline with additional Vitoss BA and infuse and then placed a second implant on the right side, again retracting the thecal sac and nerve root medially. Additional Vitoss BA with bone marrow aspirate was packed lateral to the implants.   We then packed the lateral gutter over the transverse processes and intertransverse space with Vitoss BA, infuse, and locally harvested morcellized autograft. We then selected 90 mm pre-lordosed radius rods, they were placed within the screw heads and secured with locking caps, once all 8 locking caps were placed final tightening was performed against a counter torque. We used radius locking caps  and the radius counter torque at L3 and L4, and we used 90 D. locking caps and the 90 D. counter torque at L5 and S1. A variable medium cross-link was placed between the screws at L3 and L4.  The wound had been irrigated multiple times during the procedure with saline solution and bacitracin solution, good hemostasis was established with a combination of bipolar cautery and Gelfoam with thrombin. Once good hemostasis was confirmed we proceeded with closure. We did place a medium Hemovac drain in the laminectomy defect, it was brought out through a separate stab incision. The sutures the skin with a 3-0 nylon suture. The paraspinal muscles deep fascia and Scarpa's fascia were closed with interrupted undyed 1 Vicryl sutures the subcutaneous and subcuticular closed with interrupted inverted 2-0 undyed Vicryl sutures the skin edges were approximated with Dermabond. The wound and drain exit site were dressed with sterile gauze and Hypafix.  Following surgery the patient was turned back to the supine position to be reversed and the anesthetic extubated and transferred to the recovery room for further care.    PLAN OF CARE: Admit to inpatient   PATIENT DISPOSITION:  PACU - hemodynamically stable.   Delay start of Pharmacological VTE agent (>24hrs) due to surgical blood loss or risk of bleeding:  yes

## 2013-01-21 NOTE — Anesthesia Postprocedure Evaluation (Signed)
  Anesthesia Post-op Note  Patient: CHON BUHL  Procedure(s) Performed: Procedure(s) with comments: POSTERIOR LUMBAR FUSION 2 LEVEL (N/A) - Lumbar Three-Four Lumbar Four-Five Decompression Posterior Lumbar Interbody Fusion with interbody prosthesis with posterior lateral arthrodesis and posterior segemental instrumentation  Patient Location: PACU  Anesthesia Type:General  Level of Consciousness: awake and alert   Airway and Oxygen Therapy: Patient Spontanous Breathing  Post-op Pain: mild  Post-op Assessment: Post-op Vital signs reviewed, Patient's Cardiovascular Status Stable, Respiratory Function Stable, Patent Airway, No signs of Nausea or vomiting and Pain level controlled  Post-op Vital Signs: Reviewed and stable  Complications: No apparent anesthesia complications

## 2013-01-21 NOTE — Anesthesia Procedure Notes (Signed)
Procedure Name: Intubation Date/Time: 01/21/2013 3:25 PM Performed by: Coralee Rud Pre-anesthesia Checklist: Patient identified, Emergency Drugs available, Suction available and Patient being monitored Patient Re-evaluated:Patient Re-evaluated prior to inductionOxygen Delivery Method: Circle system utilized Preoxygenation: Pre-oxygenation with 100% oxygen Intubation Type: IV induction Laryngoscope Size: Miller and 3 Grade View: Grade I Tube type: Oral Tube size: 8.0 mm Number of attempts: 1 Airway Equipment and Method: Stylet Placement Confirmation: ETT inserted through vocal cords under direct vision,  positive ETCO2 and breath sounds checked- equal and bilateral Secured at: 23 cm Tube secured with: Tape Dental Injury: Teeth and Oropharynx as per pre-operative assessment

## 2013-01-21 NOTE — Anesthesia Preprocedure Evaluation (Addendum)
Anesthesia Evaluation  Patient identified by MRN, date of birth, ID band Patient awake    Reviewed: Allergy & Precautions, H&P , NPO status   Airway Mallampati: II      Dental   Pulmonary neg pulmonary ROS,  breath sounds clear to auscultation        Cardiovascular hypertension, + CAD Rhythm:Regular Rate:Normal     Neuro/Psych    GI/Hepatic negative GI ROS, Neg liver ROS,   Endo/Other  negative endocrine ROSHypothyroidism   Renal/GU negative Renal ROS     Musculoskeletal   Abdominal   Peds  Hematology   Anesthesia Other Findings   Reproductive/Obstetrics                           Anesthesia Physical Anesthesia Plan  ASA: III  Anesthesia Plan: General   Post-op Pain Management:    Induction: Intravenous  Airway Management Planned: Oral ETT  Additional Equipment:   Intra-op Plan:   Post-operative Plan: Extubation in OR  Informed Consent:   Dental advisory given  Plan Discussed with: CRNA, Anesthesiologist and Surgeon  Anesthesia Plan Comments:         Anesthesia Quick Evaluation

## 2013-01-22 MED ORDER — TAMSULOSIN HCL 0.4 MG PO CAPS
0.8000 mg | ORAL_CAPSULE | Freq: Once | ORAL | Status: AC
  Administered 2013-01-22: 0.8 mg via ORAL
  Filled 2013-01-22: qty 2

## 2013-01-22 MED ORDER — TAMSULOSIN HCL 0.4 MG PO CAPS
0.4000 mg | ORAL_CAPSULE | Freq: Every day | ORAL | Status: DC
  Administered 2013-01-23 – 2013-01-28 (×6): 0.4 mg via ORAL
  Filled 2013-01-22 (×7): qty 1

## 2013-01-22 MED FILL — Heparin Sodium (Porcine) Inj 1000 Unit/ML: INTRAMUSCULAR | Qty: 30 | Status: AC

## 2013-01-22 NOTE — Progress Notes (Signed)
Filed Vitals:   01/21/13 2153 01/21/13 2321 01/22/13 0307 01/22/13 0747  BP: 113/62 107/60 103/58 113/62  Pulse: 65 69 76 74  Temp: 98.5 F (36.9 C) 97.3 F (36.3 C) 98.3 F (36.8 C) 99 F (37.2 C)  TempSrc: Oral Oral Oral   Resp: 20 16 16 16   SpO2: 95% 93% 93% 93%    Patient resting comfortably in bed. Staff has had difficulty with initially mobilizing patient. On exam iliopsoas on the left is 4+ to 5/5, and 5/5 on the right. The quadriceps, dorsiflexion, and plantar flexor are 5/5 bilaterally. Dressing clean and dry. Moderate drainage into Hemovac drain, we'll leave in place for now. We'll leave Foley in until patient is mobilizing well.  Plan: Plan to begin to mobilize by pivoting to chair, and having patient sit up for breakfast (patient has not had a meal for over a day and a half). Encouraged by mouth fluid intake as well as progression through the day.  Hewitt Shorts, MD 01/22/2013, 8:19 AM

## 2013-01-22 NOTE — Plan of Care (Signed)
Problem: Consults Goal: Diagnosis - Spinal Surgery Outcome: Completed/Met Date Met:  01/22/13 Thoraco/Lumbar Spine Fusion

## 2013-01-23 NOTE — Progress Notes (Signed)
Report called to Vernona Rieger, RN. Pt transferred to [id number] per MD order @ 1015. Rema Fendt, RN

## 2013-01-23 NOTE — Progress Notes (Signed)
Filed Vitals:   01/22/13 1710 01/22/13 2013 01/22/13 2319 01/23/13 0502  BP: 122/64 105/53 118/65 95/44  Pulse: 89 80 89 88  Temp: 99.2 F (37.3 C) 100 F (37.8 C) 99.2 F (37.3 C) 98.4 F (36.9 C)  TempSrc:  Oral Oral   Resp: 16 18 18 18   SpO2: 92% 93% 100% 96%    Patient has made substantial progress. Ambulating, but requiring assistance because of left knee without warning can give away. Patient and his family are concerned about returning home immediately. The feel that his wife will not be able to provide sufficient assistance for the initial care that he will require. The patient and his family have asked about inpatient rehabilitation.  Drainage into Hemovac has steadily decreased. Dressing and Hemovac drain removed. Dressing applied to drain site. Wound left open to air. Wound has no swelling, erythema, or ecchymosis.  Plan: We'll consult physical therapy and occupational therapy, as well as PM & R regarding CIR. Will transfer to 4 N. We'll continue to progress through postoperative recovery.  Hewitt Shorts, MD 01/23/2013, 7:50 AM

## 2013-01-24 MED ORDER — SENNA 8.6 MG PO TABS
1.0000 | ORAL_TABLET | Freq: Every day | ORAL | Status: DC
  Administered 2013-01-24 – 2013-01-27 (×4): 8.6 mg via ORAL
  Filled 2013-01-24 (×6): qty 1

## 2013-01-24 NOTE — Plan of Care (Signed)
Problem: Phase II Progression Outcomes Goal: Discharge plan established Outcome: Completed/Met Date Met:  01/24/13 Patient to go to rehab for some short term strengthening upon discharge.

## 2013-01-24 NOTE — Evaluation (Signed)
Physical Therapy Evaluation Patient Details Name: Justin [name] MRN: [id number] DOB: 05-27-1933 Today's Date: 01/24/2013 Time: 5621-3086 PT Time Calculation (min): 32 min  PT Assessment / Plan / Recommendation History of Present Illness  S/p Lumbar fusion; postop confusion  Clinical Impression  Patient is s/p above surgery resulting in functional limitations due to the deficits listed below (see PT Problem List). Recovery course has also been complicated by pt's confusion postop;  Patient will benefit from skilled PT to increase their independence and safety with mobility to allow discharge to the venue listed below.   Recommend CIR to maximize independence and safety prior to dc home; Still, hopefully pt's postop confusion will clear and he would be able to safely dc home; Will continue to monitor for progress      PT Assessment  Patient needs continued PT services    Follow Up Recommendations  CIR    Does the patient have the potential to tolerate intense rehabilitation      Barriers to Discharge Decreased caregiver support (Caregiver for wife) But Pt's son and daughter are willing to take shifts to assist pt and wife once he gets home    Equipment Recommendations  Rolling walker with 5" wheels;3in1 (PT)    Recommendations for Other Services     Frequency Min 5X/week    Precautions / Restrictions Precautions Precautions: Back Precaution Comments: Educated pt in back prec Required Braces or Orthoses: Spinal Brace Spinal Brace: Lumbar corset   Pertinent Vitals/Pain In considerable pain; did not rate; RN notified and patient repositioned for comfort in recliner       Mobility  Bed Mobility Bed Mobility: Rolling Left;Left Sidelying to Sit Rolling Left: 3: Mod assist;With rail Left Sidelying to Sit: 3: Mod assist;With rails Details for Bed Mobility Assistance: Cues for technique, and close guard for back prec; step-by-step cueing, though needs to be lcear and  concise Transfers Transfers: Sit to Stand;Stand to Sit Sit to Stand: 3: Mod assist;With upper extremity assist;From bed Stand to Sit: 3: Mod assist;With armrests;To chair/3-in-1 Details for Transfer Assistance: Cues for technique and posture; required reassurance that cahir was behind him to sit Ambulation/Gait Ambulation/Gait Assistance: 4: Min assist Ambulation Distance (Feet): 150 Feet Assistive device: Rolling walker Ambulation/Gait Assistance Details: Frequent cues for posture; Noted pt had difficulty carrying on a conversation while walking ; moving quite slowly; frequent standing rest breaks Gait Pattern: Decreased stride length    Exercises     PT Diagnosis: Difficulty walking;Abnormality of gait;Acute pain  PT Problem List: Decreased strength;Decreased range of motion;Decreased activity tolerance;Decreased balance;Decreased mobility;Decreased cognition;Decreased knowledge of use of DME;Pain;Decreased knowledge of precautions PT Treatment Interventions: DME instruction;Gait training;Stair training;Functional mobility training;Therapeutic activities;Therapeutic exercise;Patient/family education     PT Goals(Current goals can be found in the care plan section) Acute Rehab PT Goals Patient Stated Goal: Not stated PT Goal Formulation: With patient Time For Goal Achievement: 02/07/13 Potential to Achieve Goals: Good  Visit Information  Last PT Received On: 01/24/13 Assistance Needed: +1 History of Present Illness: S/p Lumbar fusion; postop confusion       Prior Functioning  Home Living Family/patient expects to be discharged to:: Private residence Living Arrangements: Spouse/significant other (pt is caregiver for spouse) Available Help at Discharge: Family;Other (Comment) (Son and daughter are taking shifts to care for pt's spouse) Type of Home: House Home Access: Level entry (need clarification) Home Layout: One level Home Equipment: None Prior Function Level of  Independence: Independent Communication Communication: Other (comment) (quite internally distracted by pain)  Cognition  Cognition Arousal/Alertness: Awake/alert Behavior During Therapy: WFL for tasks assessed/performed Overall Cognitive Status: Impaired/Different from baseline Area of Impairment: Attention;Problem solving Current Attention Level:  (Qutie internally distracted; difficulty conversing) Problem Solving: Slow processing;Decreased initiation;Difficulty sequencing;Requires verbal cues;Requires tactile cues General Comments: Pt's son reports postop confusion and that his mental status is far from baseline    Extremity/Trunk Assessment Upper Extremity Assessment Upper Extremity Assessment: Overall WFL for tasks assessed Lower Extremity Assessment Lower Extremity Assessment: Generalized weakness   Balance    End of Session PT - End of Session Equipment Utilized During Treatment: Back brace Activity Tolerance: Patient tolerated treatment well Patient left: in chair;with call bell/phone within reach;with family/visitor present Nurse Communication: Mobility status  GP     Justin [name] [facility] [place], Justin [name] [phone]  01/24/2013, 5:04 PM

## 2013-01-24 NOTE — Progress Notes (Signed)
Subjective: Patient reports Considerable pain. Limited mobility. Dry tacky cough has been persisting. Patient also notes no bowel movement and some constipation. Objective: Vital signs in last 24 hours: Temp:  [98 F (36.7 C)-98.9 F (37.2 C)] 98 F (36.7 C) (12/14 1018) Pulse Rate:  [73-86] 74 (12/14 1018) Resp:  [18-20] 18 (12/14 1018) BP: (89-143)/(44-65) 120/55 mmHg (12/14 1018) SpO2:  [95 %-97 %] 97 % (12/14 1018)  Intake/Output from previous day: 12/13 0701 - 12/14 0700 In: 120 [P.O.:120] Out: 325 [Urine:325] Intake/Output this shift: Total I/O In: 3 [I.V.:3] Out: -   Incision is clean and dry. Motor function appears good and lower extremities. Dressing removed.  Lab Results: No results found for this basename: WBC, HGB, HCT, PLT,  in the last 72 hours BMET No results found for this basename: NA, K, CL, CO2, GLUCOSE, BUN, CREATININE, CALCIUM,  in the last 72 hours  Studies/Results: No results found.  Assessment/Plan: Considerable back pain persists. Cough also is bothersome. Limited mobility.  LOS: 3 days  Incentive spirometer. Laxatives for bowel movement. Mobilization as tolerated.   Lora Glomski J 01/24/2013, 11:25 AM

## 2013-01-25 DIAGNOSIS — IMO0002 Reserved for concepts with insufficient information to code with codable children: Secondary | ICD-10-CM

## 2013-01-25 NOTE — Progress Notes (Signed)
Physical Therapy Treatment Patient Details Name: JONATHA GAGEN MRN: [id number] DOB: 1933/02/17 Today's Date: 01/25/2013 Time: 4098-1191 PT Time Calculation (min): 24 min  PT Assessment / Plan / Recommendation  History of Present Illness S/p Lumbar fusion; postop confusion   PT Comments   Patient continues to demonstrate safety concerns and mobility deficits. Patient required increase VCs with poor carryover during session. Ambulated with assist, as patient became fatigued increased assist required for safety. When prompting patient for cognitive clues, patient had difficulty understanding cues provided to find room number several times despite multi modal cues, difficulty understand hand placement and technique for sitting and for use of RW. Reeducated patient with teach back  On precautions, 2/4 carry over. Will continue to see and progress as tolerated.   Follow Up Recommendations  CIR     Does the patient have the potential to tolerate intense rehabilitation     Barriers to Discharge        Equipment Recommendations  Rolling walker with 5" wheels;3in1 (PT)    Recommendations for Other Services    Frequency Min 5X/week   Progress towards PT Goals Progress towards PT goals: Progressing toward goals (modestly)  Plan Current plan remains appropriate    Precautions / Restrictions Precautions Precautions: Back Precaution Comments: Educated pt in back prec Required Braces or Orthoses: Spinal Brace Spinal Brace: Lumbar corset   Pertinent Vitals/Pain 8/10    Mobility  Bed Mobility Bed Mobility: Not assessed Transfers Transfers: Sit to Stand;Stand to Sit Sit to Stand: 3: Mod assist;With upper extremity assist;From bed Stand to Sit: 3: Mod assist;With armrests;To chair/3-in-1 Details for Transfer Assistance: VCs and multi modal assist for hand placement and upright posture, assist to elevate, patient very anxious and confused by cues Ambulation/Gait Ambulation/Gait  Assistance: 4: Min assist Ambulation Distance (Feet): 420 Feet Assistive device: Rolling walker Ambulation/Gait Assistance Details: Min assist, poor stability with occassional LOB, difficulty with positioning within RW, confusion when prompted  Gait Pattern: Decreased stride length General Gait Details: poor coordination with use of RW, unable to follow VCs, requires hands on assist      PT Goals (current goals can now be found in the care plan section) Acute Rehab PT Goals Patient Stated Goal: Not stated PT Goal Formulation: With patient Time For Goal Achievement: 02/07/13 Potential to Achieve Goals: Good  Visit Information  Last PT Received On: 01/25/13 Assistance Needed: +1 History of Present Illness: S/p Lumbar fusion; postop confusion    Subjective Data  Patient Stated Goal: Not stated   Cognition  Cognition Arousal/Alertness: Awake/alert Behavior During Therapy: WFL for tasks assessed/performed Overall Cognitive Status: Impaired/Different from baseline Area of Impairment: Attention;Problem solving Current Attention Level:  (Qutie internally distracted; difficulty conversing) Problem Solving: Slow processing;Decreased initiation;Difficulty sequencing;Requires verbal cues;Requires tactile cues General Comments: reeducated patient on precautions with poor carryover today, patient with confusion as he became increasingly fatigued    Balance   poor stability with use of RW, impulsive  End of Session PT - End of Session Equipment Utilized During Treatment: Back brace Activity Tolerance: Patient tolerated treatment well Patient left: in chair;with call bell/phone within reach;with family/visitor present Nurse Communication: Mobility status   GP     Fabio Asa 01/25/2013, 5:26 PM Charlotte Crumb, PT DPT  [phone]

## 2013-01-25 NOTE — Progress Notes (Signed)
Filed Vitals:   01/24/13 2140 01/25/13 0030 01/25/13 0510 01/25/13 0930  BP: 139/72 139/63 134/65 153/76  Pulse: 104 95 80 94  Temp: 97.9 F (36.6 C) 99.5 F (37.5 C) 98.3 F (36.8 C) 99.7 F (37.6 C)  TempSrc: Oral Oral Oral Oral  Resp: 20 18 20 20   SpO2: 97% 97% 100% 100%    Patient resting in bed, moderate incisional discomfort. Ambulated in the halls twice yesterday, has not yet ambulated today, other than to the bathroom. Wound clean and dry, no swelling, erythema, ecchymosis, or drainage. Complaining of pain in the incisional area and tightness. Given Norco, but not yet given Flexeril today. Seen by PT yesterday, to be seen by OT today and hopefully PT. Seen by PM & R in consultation this morning, transfer to rehabilitation is pending. To continue PT and OT in the interim. Have discussed with nursing staff, as well as the patient and his family, the essentialness of ambulation in the hallways at least 4 times a day. Also encouraged patient to use incentive respirometer hourly.  Plan: Continue PT and OT, await CIR. Encouraged ambulation.  Hewitt Shorts, MD 01/25/2013, 11:47 AM

## 2013-01-25 NOTE — Evaluation (Signed)
Occupational Therapy Evaluation Patient Details Name: Justin [name] MRN: [id number] DOB: 16-Feb-1933 Today's Date: 01/25/2013 Time: 5621-3086 OT Time Calculation (min): 45 min  OT Assessment / Plan / Recommendation History of present illness S/p Lumbar fusion; postop confusion   Clinical Impression   Pt presents with pain limiting mobility and continued confusion requiring step by step verbal cues for mobility and back precautions.  Pt is dependent in LB ADL and requires assist of 1 for all mobility.  His family is not able to assist him at home so he will need post acute rehab.  Recommending inpatient rehab consult.   OT Assessment  Patient needs continued OT Services    Follow Up Recommendations  CIR    Barriers to Discharge Decreased caregiver support    Equipment Recommendations       Recommendations for Other Services Rehab consult  Frequency  Min 2X/week    Precautions / Restrictions Precautions Precautions: Back;Fall Precaution Booklet Issued: Yes (comment) Precaution Comments: Educated pt in back prec Required Braces or Orthoses: Spinal Brace Spinal Brace: Lumbar corset Restrictions Weight Bearing Restrictions: No   Pertinent Vitals/Pain 10/10 back with mobility, RN notified, repositioned in sitting    ADL  Eating/Feeding: Independent Where Assessed - Eating/Feeding: Bed level Grooming: Wash/dry hands;Wash/dry face;Set up Where Assessed - Grooming: Unsupported sitting Upper Body Bathing: Minimal assistance Where Assessed - Upper Body Bathing: Unsupported sitting Lower Body Bathing: +1 Total assistance Where Assessed - Lower Body Bathing: Unsupported sitting;Supported sit to stand Upper Body Dressing: Minimal assistance Where Assessed - Upper Body Dressing: Unsupported sitting Lower Body Dressing: +1 Total assistance Where Assessed - Lower Body Dressing: Unsupported sitting;Supported sit to stand Equipment Used: Gait belt;Back brace;Rolling  walker Transfers/Ambulation Related to ADLs: stood and took 2 steps toward [facility] with min-mod assist ADL Comments: Pt limited by pain.  Unable to access feet this date by crossing over opposite knee.    OT Diagnosis: Generalized weakness;Cognitive deficits;Acute pain  OT Problem List: Decreased strength;Decreased activity tolerance;Impaired balance (sitting and/or standing);Decreased cognition;Decreased knowledge of use of DME or AE;Pain OT Treatment Interventions: Self-care/ADL training;DME and/or AE instruction;Therapeutic activities;Cognitive remediation/compensation;Patient/family education   OT Goals(Current goals can be found in the care plan section) Acute Rehab OT Goals Patient Stated Goal: Not stated OT Goal Formulation: With patient Time For Goal Achievement: 02/08/13 Potential to Achieve Goals: Good ADL Goals Pt Will Perform Grooming: with supervision;standing Pt Will Perform Lower Body Bathing: with supervision;sit to/from stand;with adaptive equipment Pt Will Perform Lower Body Dressing: with supervision;with adaptive equipment;sit to/from stand Pt Will Transfer to Toilet: with supervision;ambulating Pt Will Perform Toileting - Clothing Manipulation and hygiene: with supervision;sit to/from stand Additional ADL Goal #1: Pt will generalize back precautions in mobility and ADL with supervision. Additional ADL Goal #2: Pt will perform bed mobility with supervision as a precursor to ADL.  Visit Information  Last OT Received On: 01/25/13 Assistance Needed: +1 History of Present Illness: S/p Lumbar fusion; postop confusion       Prior Functioning     Home Living Family/patient expects to be discharged to:: Private residence Living Arrangements: Spouse/significant other Available Help at Discharge: Family;Other (Comment) Type of Home: House Home Access: Level entry Home Layout: One level Home Equipment: None Prior Function Level of Independence:  Independent Communication Communication: No difficulties Dominant Hand: Right         Vision/Perception Vision - History Baseline Vision: Wears glasses all the time Patient Visual Report: No change from baseline   Cognition  Cognition Arousal/Alertness: Lethargic  Behavior During Therapy: WFL for tasks assessed/performed Overall Cognitive Status: Impaired/Different from baseline Area of Impairment: Attention;Memory;Problem solving;Orientation Orientation Level: Disoriented to;Time Current Attention Level: Selective Memory: Decreased recall of precautions;Decreased short-term memory Problem Solving: Slow processing;Difficulty sequencing;Requires verbal cues General Comments: Pt continues to have some confusion, but has improved from yesterday per daughter.    Extremity/Trunk Assessment Upper Extremity Assessment Upper Extremity Assessment: Overall WFL for tasks assessed Lower Extremity Assessment Lower Extremity Assessment: Defer to PT evaluation Cervical / Trunk Assessment Cervical / Trunk Assessment: Normal     Mobility Bed Mobility Bed Mobility: Rolling Left;Left Sidelying to Sit;Sit to Sidelying Left Rolling Left: 4: Min guard;With rail Left Sidelying to Sit: 4: Min assist;With rails;HOB flat Sit to Sidelying Left: 4: Min guard;HOB flat;With rail Details for Bed Mobility Assistance: Cues for technique, and close guard for back prec; step-by-step cueing, though needs to be lcear and concise Transfers Transfers: Sit to Stand;Stand to Sit Sit to Stand: 3: Mod assist;With upper extremity assist;From bed Stand to Sit: 3: Mod assist;With armrests;To bed Details for Transfer Assistance: cues for hand placement on walker     Exercise     Balance Balance Balance Assessed: Yes Static Sitting Balance Static Sitting - Balance Support: Feet supported Static Sitting - Level of Assistance: 5: Stand by assistance Static Sitting - Comment/# of Minutes: 3   End of Session OT -  End of Session Activity Tolerance: Patient limited by pain Patient left: in bed;with call bell/phone within reach;with family/visitor present;with bed alarm set Nurse Communication: Patient requests pain meds  GO     Evern Bio 01/25/2013, 9:45 AM [phone]

## 2013-01-25 NOTE — Consult Note (Signed)
Physical Medicine and Rehabilitation Consult Reason for Consult: Lumbar stenosis with radiculopathy Referring Physician: Dr. Newell Coral   HPI: Justin [name] is a 79 y.o. right-handed male with history of chronic back pain status post L5-S1 lumbar decompression, PLIF January 2006. Patient independent prior to admission and was the main caregiver for his wife who has poor health. Admitted 01/21/2013 with low back pain radiating to the lower extremities aggravated by standing and walking. MRI and imaging revealed a very large left L3-L4 foraminal and extraforaminal disc herniation with severe compression of the nerve root as well as lumbar stenosis with spondylosis radiculopathy. Underwent L3-L5 decompressive lumbar laminectomy with bilateral facetectomy and foraminotomies with decompression and L2-3,3-4 fusion 01/22/2012 per Dr. Newell Coral. Postoperative pain management. Lumbar corset when out of bed. Bouts of confusion felt to be related to narcotics and monitored closely. Patient remains on Florinef for history of orthostatic hypotension. Physical therapy evaluation completed 01/24/2013 with recommendations of physical medicine rehabilitation consult to consider inpatient rehabilitation services.  Patient has been confused after pain medications. Daughter in the room, states that patient did not tolerate taking 2 Percocet tablets at once  Review of Systems  Genitourinary: Positive for urgency.  Musculoskeletal: Positive for back pain and myalgias.  Psychiatric/Behavioral: The patient has insomnia.        Anxiety  All other systems reviewed and are negative.   Past Medical History  Diagnosis Date  . BPH (benign prostatic hypertrophy)   . HLD (hyperlipidemia)     takes Simvastatin nightly  . Hypothyroidism     takes Synthroid daily  . Anxiety     takes Klonopin daily as needed  . Arthritis   . Joint pain   . Chronic back pain     stenosis,ddd,and HNP  . History of colon polyps   .  Enlarged prostate   . Cataracts, bilateral     immature  . Insomnia     doesn't take any meds  . Hypotension    Past Surgical History  Procedure Laterality Date  . Rotator cuff repair Right 1988  . Tonsillectomy      as a child  . Back surgery  2005  . Colonoscopy    . Cardiac catheterization  11/2009    Had 3 previous cath. Most recent was in 2011. LAD: 40% mid stenosis, D1: 50 ostial , RCA: 20-30%. Normal EF   Family History  Problem Relation Age of Onset  . Cancer Mother     breast  . Heart disease    . Heart attack    . Asthma Father    Social History:  reports that he has never smoked. He has never used smokeless tobacco. He reports that he does not drink alcohol or use illicit drugs. Allergies:  Allergies  Allergen Reactions  . Adhesive [Tape]     Rash and itching  . Imdur [Isosorbide Mononitrate]     Headache  . Neosporin [Neomycin-Polymyxin-Gramicidin] Rash   Medications Prior to Admission  Medication Sig Dispense Refill  . acetaminophen (TYLENOL) 500 MG tablet Take 500 mg by mouth every 6 (six) hours as needed for moderate pain.      Marland Kitchen aspirin 81 MG tablet Take 81 mg by mouth daily.        . clonazePAM (KLONOPIN) 0.5 MG tablet Take 0.5 mg by mouth daily as needed for anxiety.       . fludrocortisone (FLORINEF) 0.1 MG tablet Take 0.1 mg by mouth daily as needed (low blood pressure).      Marland Kitchen  fluticasone (FLONASE) 50 MCG/ACT nasal spray Place 2 sprays into both nostrils daily.       Marland Kitchen HYDROcodone-acetaminophen (NORCO) 7.5-325 MG per tablet Take 1 tablet by mouth every 6 (six) hours as needed for moderate pain.      Marland Kitchen levothyroxine (SYNTHROID, LEVOTHROID) 100 MCG tablet Take 100 mcg by mouth daily.        . Multiple Vitamin (MULTIVITAMIN) tablet Take 1 tablet by mouth daily.        . simvastatin (ZOCOR) 80 MG tablet Take 80 mg by mouth at bedtime.        . nitroGLYCERIN (NITROSTAT) 0.4 MG SL tablet Place 0.4 mg under the tongue every 5 (five) minutes as needed.           Home: Home Living Family/patient expects to be discharged to:: Private residence Living Arrangements: Spouse/significant other Available Help at Discharge: Family;Other (Comment) (Son and daughter are taking shifts to care for pt's spouse) Type of Home: House Home Access: Level entry (need clarification) Home Layout: One level Home Equipment: None  Functional History:   Functional Status:  Mobility: Bed Mobility Bed Mobility: Rolling Left;Left Sidelying to Sit Rolling Left: 3: Mod assist;With rail Left Sidelying to Sit: 3: Mod assist;With rails Transfers Transfers: Sit to Stand;Stand to Sit Sit to Stand: 3: Mod assist;With upper extremity assist;From bed Stand to Sit: 3: Mod assist;With armrests;To chair/3-in-1 Ambulation/Gait Ambulation/Gait Assistance: 4: Min assist Ambulation Distance (Feet): 150 Feet Assistive device: Rolling walker Ambulation/Gait Assistance Details: Frequent cues for posture; Noted pt had difficulty carrying on a conversation while walking ; moving quite slowly; frequent standing rest breaks Gait Pattern: Decreased stride length    ADL:    Cognition: Cognition Overall Cognitive Status: Impaired/Different from baseline Orientation Level: Oriented X4 Cognition Arousal/Alertness: Awake/alert Behavior During Therapy: WFL for tasks assessed/performed Overall Cognitive Status: Impaired/Different from baseline Area of Impairment: Attention;Problem solving Current Attention Level:  (Qutie internally distracted; difficulty conversing) Problem Solving: Slow processing;Decreased initiation;Difficulty sequencing;Requires verbal cues;Requires tactile cues General Comments: Pt's son reports postop confusion and that his mental status is far from baseline  Blood pressure 139/63, pulse 95, temperature 99.5 F (37.5 C), temperature source Oral, resp. rate 18, SpO2 97.00%. Physical Exam  Constitutional: He appears well-developed.  HENT:  Head:  Normocephalic.  Eyes: EOM are normal.  Neck: Normal range of motion. Neck supple. No thyromegaly present.  Cardiovascular: Normal rate and regular rhythm.   Respiratory: Effort normal and breath sounds normal. No respiratory distress.  GI: Soft. Bowel sounds are normal. He exhibits no distension.  Neurological:  Patient is alert and somewhat distracted by pain. He was able to provide his name, age and date of birth. He follows simple commands  Skin:  Back incision is dressed   motor strength is 5/5 in bilateral deltoid, bicep, tricep, grip 4/5 bilateral hip flexors knee extensors ankle dorsiflexors and plantar flexors. Sensory exam is normal bilateral upper and lower extremities  No results found for this or any previous visit (from the past 24 hour(s)). No results found.  Assessment/Plan: Diagnosis: Lumbar spinal stenosis with lumbar radiculopathy status post decompression and fusion 1. Does the need for close, 24 hr/day medical supervision in concert with the patient's rehab needs make it unreasonable for this patient to be served in a less intensive setting? Yes 2. Co-Morbidities requiring supervision/potential complications: Postoperative pain, postoperative confusion, hypertension, coronary artery disease, history of hypotension 3. Due to bladder management, bowel management, safety, skin/wound care, disease management, medication administration, pain management and patient  education, does the patient require 24 hr/day rehab nursing? Yes 4. Does the patient require coordinated care of a physician, rehab nurse, PT (1-2 hrs/day, 5 days/week), OT (1-2 hrs/day, 5 days/week) and SLP (0.5-1 hrs/day, 5 days/week) to address physical and functional deficits in the context of the above medical diagnosis(es)? Yes Addressing deficits in the following areas: balance, endurance, locomotion, strength, transferring, bowel/bladder control, bathing, dressing, feeding, grooming, toileting and  cognition 5. Can the patient actively participate in an intensive therapy program of at least 3 hrs of therapy per day at least 5 days per week? Yes 6. The potential for patient to make measurable gains while on inpatient rehab is good 7. Anticipated functional outcomes upon discharge from inpatient rehab are supervision to modified independent with PT, modified independent with OT, 100% orientation, independent medication management with SLP. 8. Estimated rehab length of stay to reach the above functional goals is: 7-10 days 9. Does the patient have adequate social supports to accommodate these discharge functional goals? Yes 10. Anticipated D/C setting: Home 11. Anticipated post D/C treatments: HH therapy 12. Overall Rehab/Functional Prognosis: good  RECOMMENDATIONS: This patient's condition is appropriate for continued rehabilitative care in the following setting: CIR Patient has agreed to participate in recommended program. Yes Note that insurance prior authorization may be required for reimbursement for recommended care.  Comment: Daughter is very supportive    01/25/2013

## 2013-01-26 NOTE — Progress Notes (Signed)
Patient refused to go for his MRI, requested that he needs to speak to his son first but no contact number. Called daughter twice but unable to reach. MRI staff notified and will reschedule in am.

## 2013-01-26 NOTE — Progress Notes (Signed)
Occupational Therapy Treatment Patient Details Name: Justin [name] MRN: [id number] DOB: 07-Feb-1934 Today's Date: 01/26/2013 Time: 4132-4401 OT Time Calculation (min): 11 min  OT Assessment / Plan / Recommendation  History of present illness S/p Lumbar fusion; postop confusion   OT comments  Pt. Asleep but easily awaken and agreeable to participation in skilled o.t., still requiring verbal and tactile inst. Cues for safe completion of tasks.  Pt. Aware of deficits asking "what will it be like when i go home, will i just have to figure all of this out"  Reviewed with pt. He will need con't. Therapy and he agreed and was visibly relieved.    Follow Up Recommendations  CIR;SNF                  Recommendations for Other Services Rehab consult, SNF  Frequency Min 2X/week   Progress towards OT Goals Progress towards OT goals: Progressing toward goals  Plan Discharge plan remains appropriate    Precautions / Restrictions Precautions Precautions: Back Precaution Comments: reviewed back precautions, pt. unable to recall any of them  Required Braces or Orthoses: Spinal Brace Spinal Brace: Lumbar corset   Pertinent Vitals/Pain 7/10, repositioned    ADL  Lower Body Dressing: Supervision/safety;Moderate assistance Where Assessed - Lower Body Dressing: Supported sitting Toilet Transfer: Minimal assistance;Simulated Toilet Transfer Method: Sit to stand;Stand pivot Toileting - Clothing Manipulation and Hygiene: Simulated;Moderate assistance;Minimal assistance Where Assessed - Toileting Clothing Manipulation and Hygiene: Standing Transfers/Ambulation Related to ADLs: cues for walker safety and hand placement ADL Comments: able to don/doff R sock by crossing le over knee, but not the L, states LLE was very limited prior to sx, states he would use his "sock thingy"  Able to don back brace with set up in un supported sitting     OT Goals(current goals can now be found in the care plan  section)    Visit Information  Last OT Received On: 01/26/13 History of Present Illness: S/p Lumbar fusion; postop confusion    Subjective Data   (see above comments)          Cognition  Cognition Arousal/Alertness: Awake/alert Behavior During Therapy: WFL for tasks assessed/performed Overall Cognitive Status: Impaired/Different from baseline Area of Impairment: Attention;Problem solving Memory: Decreased recall of precautions;Decreased short-term memory Problem Solving: Slow processing;Decreased initiation;Difficulty sequencing;Requires verbal cues;Requires tactile cues General Comments: pt. still unable to recall precautions even with demo    Mobility  Bed Mobility Details for Bed Mobility Assistance: cues for tech. but able to bring b les out of bed and scoot eob Transfers Transfers: Sit to Stand;Stand to Sit Sit to Stand: 4: Min guard;From bed Stand to Sit: 4: Min assist;To chair/3-in-1 Details for Transfer Assistance: vcs and hand over hand assist for hand placement after cues to reach back for arm rests of chairs               End of Session OT - End of Session Activity Tolerance: Patient tolerated treatment well Patient left: in chair;with call bell/phone within reach       Robet Leu , COTA/L 01/26/2013, 3:29 PM

## 2013-01-26 NOTE — Progress Notes (Signed)
Patient ambulated in hallway 3x with RN. Able to walk around entire unit (633ft) with a walker/brace, tolerated well, standby assist. Rated pain 6/10 upon finished ambulating, medicated with Vicodin and flexeril. Able to achieve 2220 on incentive spirometer. Have continued to encourage IS several times an hour as patient has had a nonproductive cough throughout the day. Patient verbalized understanding of instructions.  Will continue to monitor.

## 2013-01-26 NOTE — Progress Notes (Signed)
Filed Vitals:   01/25/13 2118 01/26/13 0149 01/26/13 0559 01/26/13 0735  BP: 137/65 122/65 128/67   Pulse: 74 73 70   Temp: 98.6 F (37 C) 98.5 F (36.9 C) 98 F (36.7 C)   TempSrc: Oral Oral Oral   Resp: 18 20 20    Height:    5\' 11"  (1.803 m)  Weight:    71.215 kg (157 lb)  SpO2: 100% 97% 100%     Patient sitting up having breakfast, walked some yesterday, encouraged to walk with nursing staff at least 4-5 times today. Gradually becoming more comfortable as well as more stable. Continue to work with PT and OT, Animal nutritionist for [employer].  Plan: Continued to progress through postoperative recovery. Encouraged to ambulate.  Hewitt Shorts, MD 01/26/2013, 8:11 AM

## 2013-01-26 NOTE — Progress Notes (Signed)
Daughter called Dr Gae Dry office and left message for him about patients confusion not seeming to get any better. RN unaware daughter concerned and unaware she called the office. Patient has been alert, oriented and appropriate throughout the day with only slight confusion about the date at times (usually after vicodin has been given).  Vicodin was given at 1604 and around 1715 patient started rambling to family about things that did not make sense (per daughter). Patient stated he does at times feel loopy and realizes he is sometimes confused. Patient walked around hallway with steady gait after pain medication given.  Encouraged patient to drink more fluids. Spoke with Dr Newell Coral who ordered an MRI of brain. Awaiting transport to take patient down to MRI. Will continue to monitor.

## 2013-01-26 NOTE — Progress Notes (Signed)
Rehab admissions - Evaluated for possible admission.  I have received a denial from the on site insurance case manager for acute inpatient rehab admission.  Options will be home with [facility] or could consider SNF if needed.  Call me for questions.  #[phone]

## 2013-01-26 NOTE — Progress Notes (Signed)
PT Cancellation Note  Patient Details Name: Justin [name] MRN: [id number] DOB: [birth year]   Cancelled Treatment:    Reason Eval/Treat Not Completed: Other (comment) Pt just finishing ambulating in hall with nsg, will defer further ambulation with PT until tomorrow.    Fabio Asa 01/26/2013, 3:58 PM

## 2013-01-27 ENCOUNTER — Inpatient Hospital Stay (HOSPITAL_COMMUNITY): Payer: Medicare Other

## 2013-01-27 LAB — CBC WITH DIFFERENTIAL/PLATELET
Basophils Absolute: 0 10*3/uL (ref 0.0–0.1)
Basophils Relative: 0 % (ref 0–1)
Eosinophils Absolute: 0.3 10*3/uL (ref 0.0–0.7)
Eosinophils Relative: 4 % (ref 0–5)
HCT: 29 % — ABNORMAL LOW (ref 39.0–52.0)
Hemoglobin: 10 g/dL — ABNORMAL LOW (ref 13.0–17.0)
Lymphocytes Relative: 11 % — ABNORMAL LOW (ref 12–46)
Lymphs Abs: 0.7 10*3/uL (ref 0.7–4.0)
MCH: 32.7 pg (ref 26.0–34.0)
MCHC: 34.5 g/dL (ref 30.0–36.0)
MCV: 94.8 fL (ref 78.0–100.0)
Monocytes Absolute: 0.8 10*3/uL (ref 0.1–1.0)
Monocytes Relative: 12 % (ref 3–12)
Neutro Abs: 4.9 10*3/uL (ref 1.7–7.7)
Neutrophils Relative %: 73 % (ref 43–77)
Platelets: 193 10*3/uL (ref 150–400)
RBC: 3.06 MIL/uL — ABNORMAL LOW (ref 4.22–5.81)
RDW: 12.1 % (ref 11.5–15.5)
WBC: 6.8 10*3/uL (ref 4.0–10.5)

## 2013-01-27 LAB — BASIC METABOLIC PANEL
BUN: 18 mg/dL (ref 6–23)
CO2: 27 mEq/L (ref 19–32)
Calcium: 8.4 mg/dL (ref 8.4–10.5)
Chloride: 102 mEq/L (ref 96–112)
Creatinine, Ser: 0.86 mg/dL (ref 0.50–1.35)
GFR calc Af Amer: >90 mL/min (ref >90)
GFR calc non Af Amer: 82 mL/min — ABNORMAL LOW (ref >90)
Glucose, Bld: 115 mg/dL — ABNORMAL HIGH (ref 70–99)
Potassium: 3.9 mEq/L (ref 3.5–5.1)
Sodium: 137 mEq/L (ref 135–145)

## 2013-01-27 MED ORDER — GADOBENATE DIMEGLUMINE 529 MG/ML IV SOLN
15.0000 mL | Freq: Once | INTRAVENOUS | Status: AC
  Administered 2013-01-27: 15 mL via INTRAVENOUS

## 2013-01-27 NOTE — Progress Notes (Signed)
Subjective: Patient sitting up in chair, eating lunch. Patient's nurse reports that he ambulate twice this morning around the halls of unit. She'll she reports that his wound is healing nicely.  I was called yesterday by the patient's daughter, Selena Batten. She was concerned because she feels that her father has had episodic confusion and difficulty with speech. I ordered an MRI of the brain without and with contrast last night, however the patient refuses study last night.  Objective: Vital signs in last 24 hours: Filed Vitals:   01/26/13 2125 01/27/13 0200 01/27/13 0511 01/27/13 1000  BP: 130/67 142/69 172/75 119/58  Pulse: 74 77 84 88  Temp: 98 F (36.7 C) 98.3 F (36.8 C) 98.1 F (36.7 C) 98.4 F (36.9 C)  TempSrc: Oral Oral Oral Oral  Resp: 18 18 18 18   Height:      Weight:      SpO2: 97% 98% 99% 100%    Intake/Output from previous day: 12/16 0701 - 12/17 0700 In: 720 [P.O.:720] Out: 200 [Urine:200] Intake/Output this shift: Total I/O In: 240 [P.O.:240] Out: -   Physical Exam:  Patient awake alert, however his speech shows evidence of word substitutions and paraphasic errors, suggestive of a aphasia. He has good motor function.  Assessment/Plan: I remained concerned about the possibility the patient may have had a stroke. I've spoken at length with the patient at his bedside, and recommended that we evaluate his language dysfunction with an MRI of the brain without and with contrast, and after having discussed this with me he's agreed to undergo the study. Certainly discharge planning will need to wait until we've assess the cause of his language dysfunction.   Justin Shorts, MD 01/27/2013, 12:57 PM

## 2013-01-27 NOTE — Progress Notes (Signed)
Called MRI twice about active order for MRI brain, Radiology reports they are "backed up from ED MRIs but will make note to get MRI done by tonight."  Patient and family updated about delay.

## 2013-01-27 NOTE — Progress Notes (Signed)
Talked to patient about DCP; patient plans to return home at discharge; [facility] choices offered, patient chose [facility]; Mary with [facility] called for arrangements; Attending MD please order at discharge HHPT/ rolling walker. Abelino Derrick RN,BSN,MHA [phone]

## 2013-01-27 NOTE — Progress Notes (Signed)
Physical Therapy Treatment Patient Details Name: Justin [name] MRN: [id number] DOB: 1933/07/20 Today's Date: 01/27/2013 Time: 4098-1191 PT Time Calculation (min): 24 min  PT Assessment / Plan / Recommendation  History of Present Illness S/p Lumbar fusion; postop confusion   PT Comments   Patient not approved for CIR. At this time patient is wanting to return home with wife, whom will be available 24/7 per patient report. Will need to clarify. Patient also has daughter who lives close by. Per RN patients confusion is clearing and patient reporting that he is sometime forgetful. Will continue to follow for mobility.   Follow Up Recommendations  Home health PT;Supervision/Assistance - 24 hour     Does the patient have the potential to tolerate intense rehabilitation     Barriers to Discharge        Equipment Recommendations  Rolling walker with 5" wheels;3in1 (PT)    Recommendations for Other Services    Frequency Min 5X/week   Progress towards PT Goals Progress towards PT goals: Progressing toward goals  Plan Discharge plan needs to be updated    Precautions / Restrictions Precautions Precautions: Back Precaution Comments: Patient able to recall no reaching or twisting. Reeducated on all precautions Required Braces or Orthoses: Spinal Brace Spinal Brace: Lumbar corset Restrictions Weight Bearing Restrictions: No   Pertinent Vitals/Pain 2/10 back pain. RN aware    Mobility  Bed Mobility Rolling Left: 5: Supervision Left Sidelying to Sit: 5: Supervision Details for Bed Mobility Assistance: cues to ensure precautions and technique Transfers Sit to Stand: 4: Min guard;From bed;From chair/3-in-1 Stand to Sit: 4: Min assist;To chair/3-in-1;To bed Details for Transfer Assistance: Cues for hand placement Ambulation/Gait Ambulation/Gait Assistance: 4: Min guard Ambulation Distance (Feet): 450 Feet Assistive device: Rolling walker Ambulation/Gait Assistance Details: Patient  with no LOB this session. Cues for RW needed.  Gait Pattern: Decreased stride length    Exercises     PT Diagnosis:    PT Problem List:   PT Treatment Interventions:     PT Goals (current goals can now be found in the care plan section)    Visit Information  Last PT Received On: 01/27/13 Assistance Needed: +1 History of Present Illness: S/p Lumbar fusion; postop confusion    Subjective Data      Cognition  Cognition Arousal/Alertness: Awake/alert Behavior During Therapy: WFL for tasks assessed/performed Overall Cognitive Status: Impaired/Different from baseline Memory: Decreased recall of precautions;Decreased short-term memory Problem Solving: Slow processing;Decreased initiation;Difficulty sequencing;Requires verbal cues    Balance     End of Session PT - End of Session Equipment Utilized During Treatment: Back brace Activity Tolerance: Patient tolerated treatment well Patient left: in chair;with call bell/phone within reach;with family/visitor present Nurse Communication: Mobility status   GP     Fredrich Birks 01/27/2013, 9:37 AM 01/27/2013 Fredrich Birks PTA [phone] pager [phone] office

## 2013-01-28 MED ORDER — CYCLOBENZAPRINE HCL 5 MG PO TABS: 5.0000 mg | ORAL_TABLET | Freq: Two times a day (BID) | ORAL | Status: DC | PRN

## 2013-01-28 MED ORDER — CYCLOBENZAPRINE HCL 10 MG PO TABS: 5.0000 mg | ORAL_TABLET | Freq: Three times a day (TID) | ORAL | Status: DC | PRN

## 2013-01-28 MED ORDER — HYDROCODONE-ACETAMINOPHEN 5-325 MG PO TABS: 0.5000 | ORAL_TABLET | ORAL | Status: DC | PRN

## 2013-01-28 NOTE — Progress Notes (Signed)
Physical Therapy Treatment Patient Details Name: Justin [name] MRN: [id number] DOB: [birth year] Today's Date: 01/28/2013 Time: 4098-1191 PT Time Calculation (min): 24 min  PT Assessment / Plan / Recommendation  History of Present Illness S/p Lumbar fusion; postop confusion   PT Comments   Patient continues to do well with mobility however patient unable to maintain precautions with mobility. Patient attempting to reach down for toilet paper from a standing position and needed cueing to no bend over. Patient also required cueing for log roll technique and for brace wear. MD spole with me and stated wife will be home with patient and feels as if patients confusion will clear once home  Follow Up Recommendations  Home health PT;Supervision/Assistance - 24 hour     Does the patient have the potential to tolerate intense rehabilitation     Barriers to Discharge        Equipment Recommendations  Rolling walker with 5" wheels;3in1 (PT)    Recommendations for Other Services    Frequency Min 5X/week   Progress towards PT Goals Progress towards PT goals: Progressing toward goals  Plan Current plan remains appropriate    Precautions / Restrictions Precautions Precautions: Back Precaution Comments: Patient able to recall precautions however unable to maintain with mobility (see comments) Required Braces or Orthoses: Spinal Brace Spinal Brace: Lumbar corset Restrictions Weight Bearing Restrictions: No   Pertinent Vitals/Pain     Mobility  Bed Mobility Rolling Left: 5: Supervision Left Sidelying to Sit: 5: Supervision Details for Bed Mobility Assistance: Max cues for log roll technique as patient attempt to sit straight up verses rolling Transfers Sit to Stand: 4: Min guard;From bed;From chair/3-in-1 Stand to Sit: 4: Min guard;To chair/3-in-1;To bed Details for Transfer Assistance: Cues for hand placement. Patient shaky with stand but no LOB Ambulation/Gait Ambulation/Gait  Assistance: 4: Min guard Ambulation Distance (Feet): 600 Feet Assistive device: Rolling walker Ambulation/Gait Assistance Details: Patient with safer use of RW this session. Minguard to ensure safety Gait Pattern: Decreased stride length    Exercises     PT Diagnosis:    PT Problem List:   PT Treatment Interventions:     PT Goals (current goals can now be found in the care plan section)    Visit Information  Last PT Received On: 01/28/13 Assistance Needed: +1 History of Present Illness: S/p Lumbar fusion; postop confusion    Subjective Data      Cognition  Cognition Arousal/Alertness: Awake/alert Behavior During Therapy: WFL for tasks assessed/performed Overall Cognitive Status: Impaired/Different from baseline Memory: Decreased recall of precautions;Decreased short-term memory Problem Solving: Slow processing;Decreased initiation;Difficulty sequencing;Requires verbal cues General Comments: unable to maintian precautions. Patient laid back in bed with brace on. Educated for proper brace wear    Balance     End of Session PT - End of Session Equipment Utilized During Treatment: Back brace Activity Tolerance: Patient tolerated treatment well Patient left: with call bell/phone within reach;with family/visitor present;in bed;with bed alarm set Nurse Communication: Mobility status   GP     Fredrich Birks 01/28/2013, 11:19 AM 01/28/2013 Fredrich Birks PTA [phone] pager [phone] office

## 2013-01-28 NOTE — Progress Notes (Signed)
Subjective: Patient up and ambulating in the halls with rolling walker. MRI scan last night showed mild generalized atrophy, no evidence of CVA, no intra-or extra-axial masses (tumors, hemorrhages, etc.).  Objective: Vital signs in last 24 hours: Filed Vitals:   01/27/13 1835 01/27/13 2242 01/28/13 0045 01/28/13 0550  BP: 134/59 168/77 145/61 181/81  Pulse: 87 85 82 88  Temp: 98.4 F (36.9 C) 98.1 F (36.7 C) 98.2 F (36.8 C) 98.8 F (37.1 C)  TempSrc: Oral Oral Oral Oral  Resp: 18 18 20 18   Height:      Weight:      SpO2: 100% 98% 95% 97%    Intake/Output from previous day: 12/17 0701 - 12/18 0700 In: 540 [P.O.:540] Out: -  Intake/Output this shift:    Physical Exam:  Awake alert, language function better today, without paraphasic errors or word substitution's. Moving all extremities well.  CBC  Recent Labs  01/27/13 1505  WBC 6.8  HGB 10.0*  HCT 29.0*  PLT 193   BMET  Recent Labs  01/27/13 1505  NA 137  K 3.9  CL 102  CO2 27  GLUCOSE 115*  BUN 18  CREATININE 0.86  CALCIUM 8.4   Studies/Results: Mr Justin [name] Wo Contrast  01/28/2013   CLINICAL DATA:  Evaluate for stroke, recently postop for spinal fusion  EXAM: MRI HEAD WITHOUT AND WITH CONTRAST  TECHNIQUE: Multiplanar, multiecho pulse sequences of the brain and surrounding structures were obtained without and with intravenous contrast.  CONTRAST:  15mL MULTIHANCE GADOBENATE DIMEGLUMINE 529 MG/ML IV SOLN  COMPARISON:  Prior CT from 02/15/2010  FINDINGS: Scattered foci of hyperintense T2/FLAIR signal within the periventricular and deep white matter are present, most compatible with chronic microvascular ischemic disease, moderate for patient age. Similar changes are seen within the pons per Mild diffuse cerebral volume loss is noted. No mass lesion, midline shift, extra-axial fluid collection, or hydrocephalus.  Craniocervical junction is widely patent and within normal limits. The pituitary gland is normal. The  globes and optic nerves are normal.  No diffusion-weighted signal abnormality is identified to suggest acute intracranial infarct. Normal flow voids are seen within the intracranial vasculature. No intracranial hemorrhage seen on the gradient echo sequence. Gray-white matter differentiation is maintained.  Circumferential T2 hyperintensity is seen within the right maxillary sinus. Small amount of T2 hyperintensity is seen layering within the left maxillary sinus as well. Scattered mucoperiosteal thickening present within the ethmoidal air cells. Air-fluid levels present within the sphenoid sinuses bilaterally. Mucosal thickening noted within the right frontal sinus.  No mastoid effusion.  IMPRESSION: 1. No acute intracranial infarct or other abnormality identified. No abnormal enhancement. 2. Mild cerebral atrophy with moderate chronic microvascular ischemic disease. 3. Paranasal sinus disease as above.   Electronically Signed   By: Rise Mu M.D.   On: 01/28/2013 01:14    Assessment/Plan: Mental status improved today. MRI looks good. BMET and CBC with differential look good.  Spoke by phone at length with his daughter Justin [name] [phone]). I explained the patient's condition today and discussed with her the confusion and language dysfunction that he has had. I also discussed with her the results of his laboratories and MRI scan. With the studies looking good, and his function better today, I tend to favor disposition to home rather than SNF, since some of the confusion and dysfunction may be coming from lack of familiar surroundings. I also feel that his medications may be tending to cause some are contributing to his confusion  and language dysfunction, and therefore I have favored limiting of his pain medication and muscle relaxants. I've discussed with the unit charge nurse, Tami, arranging for both a walker with 5 inch wheels as well as a 3 in 1 for his use of home. I will consult further with  the family later today regarding final disposition plans.   Hewitt Shorts, MD 01/28/2013, 8:15 AM

## 2013-01-28 NOTE — Progress Notes (Signed)
Patient was just rolled out of the door. Came out of the slight confusion he had from the narcotic this morning. Assessments remain unchanged as at now.

## 2013-01-28 NOTE — Progress Notes (Signed)
Talked to patient with bedside nurse present about HHC, patient stated that he does not need any HHC at this time and bedside nurse agreed/ patient up walking well with walker and is going to stay with family members at discharge;

## 2013-01-28 NOTE — Progress Notes (Signed)
Reviewed discharge documentation with son at bedside.  Patient remains sleepy at this time.  Son is taking patient belongings home and bringing a vehicle back that is easier for the patient to ride in.  Will continue to monitor patient wihile son is away.  Discussed concerns upon patient returning home.  Son feels patient will be fine with his wife and family checking in on him frequently.

## 2013-01-28 NOTE — Discharge Summary (Signed)
Physician Discharge Summary  Patient ID: Justin [name] MRN: [id number] DOB/AGE: 02-13-1933 79 y.o.  Admit date: 01/21/2013 Discharge date: 01/28/2013  Admission Diagnoses:  Left L34 lumbar herniated disc, lumbar stenosis, lumbar degenerative disc disease, lumbar spondylosis, lumbar degenerative scoliosis  Discharge Diagnoses:  Left L34 lumbar herniated disc, lumbar stenosis, lumbar degenerative disc disease, lumbar spondylosis, lumbar degenerative scoliosis  Active Problems:   HNP (herniated nucleus pulposus), lumbar  Discharged Condition: good  Hospital Course: Patient was admitted, underwent an L3-L5 decompressive lumbar laminectomy, bilateral L3-4 and L4-5 PLIF, and a bilateral L3-L5 posterior lateral arthrodesis. Postoperatively he has had good relief of his radiculopathy. He's been able to gradually mobilize. His wound has been healing well. His incisional discomfort is gradually diminished, and he has been able to ambulate more and more. PT and OT were consulted to work with the patient on mobility, transfers, ambulation, and ADLs. Physical medicine and rehabilitation was consulted regarding CIR, they felt he would benefit from it, but it was not approved by his insurance carrier. He is continued to receive PT and OT, and at this point he is ambulatory with a walker. He is wearing his lumbar brace, and he is able to don and doff. He is able to go to the bathroom. She has had some difficulties with episodic altered mental status primarily confusion and expressive language dysfunction. Repeat laboratories were unremarkable. MRI of the brain without and with gadolinium showed no evidence of stroke or intracranial mass, and was only notable for mild generalized atrophy.  I spoke with the patient, as well as with his son and daughter at length about disposition plans. I feel that some of his difficulties with altered mental status is due to an unfamiliar setting, but some of it is due to his  pain medication and muscle relaxants being administered by the staff. In the end I feel that he and his family will actually be able to better control the amount of medication he receives in a home setting in a home setting will be familiar to him. With this in mind the patient, his children, and myself, all feel it will be best to discharge him to home. I've given the patient, his son, and his daughter instructions regarding wound care and activities following discharge. He is to return for followup with me in 2-3 weeks. I strongly encouraged minimizing the amount of hydrocodone and cyclobenzaprine used during the post discharge period, and instead have encouraged the use of Tylenol and Advil as feasible and as needed.  Discharge Exam: Blood pressure 141/68, pulse 78, temperature 98.1 F (36.7 C), temperature source Oral, resp. rate 18, height 5\' 11"  (1.803 m), weight 71.215 kg (157 lb), SpO2 95.00%.  Disposition: Home     Medication List         acetaminophen 500 MG tablet  Commonly known as:  TYLENOL  Take 500 mg by mouth every 6 (six) hours as needed for moderate pain.     aspirin 81 MG tablet  Take 81 mg by mouth daily.     clonazePAM 0.5 MG tablet  Commonly known as:  KLONOPIN  Take 0.5 mg by mouth daily as needed for anxiety.     cyclobenzaprine 5 MG tablet  Commonly known as:  FLEXERIL  Take 1-2 tablets (5-10 mg total) by mouth 2 (two) times daily as needed for muscle spasms.     fludrocortisone 0.1 MG tablet  Commonly known as:  FLORINEF  Take 0.1 mg by mouth daily as needed (  low blood pressure).     fluticasone 50 MCG/ACT nasal spray  Commonly known as:  FLONASE  Place 2 sprays into both nostrils daily.     HYDROcodone-acetaminophen 5-325 MG per tablet  Commonly known as:  NORCO/VICODIN  Take 0.5-1 tablets by mouth every 4 (four) hours as needed for moderate pain or severe pain.     HYDROcodone-acetaminophen 7.5-325 MG per tablet  Commonly known as:  NORCO  Take 1  tablet by mouth every 6 (six) hours as needed for moderate pain.     levothyroxine 100 MCG tablet  Commonly known as:  SYNTHROID, LEVOTHROID  Take 100 mcg by mouth daily.     multivitamin tablet  Take 1 tablet by mouth daily.     nitroGLYCERIN 0.4 MG SL tablet  Commonly known as:  NITROSTAT  Place 0.4 mg under the tongue every 5 (five) minutes as needed.     simvastatin 80 MG tablet  Commonly known as:  ZOCOR  Take 80 mg by mouth at bedtime.         Signed: Hewitt Shorts, MD 01/28/2013, 12:40 PM

## 2013-02-22 NOTE — Progress Notes (Signed)
Meridee Branum, PT DPT  [phone]  

## 2013-02-23 ENCOUNTER — Other Ambulatory Visit: Payer: Self-pay | Admitting: Neurosurgery

## 2013-02-23 DIAGNOSIS — M5416 Radiculopathy, lumbar region: Secondary | ICD-10-CM

## 2013-02-25 ENCOUNTER — Ambulatory Visit
Admission: RE | Admit: 2013-02-25 | Discharge: 2013-02-25 | Disposition: A | Discharge status: Home / Self Care | Payer: Medicare Other | Source: Ambulatory Visit | Attending: Neurosurgery | Admitting: Neurosurgery

## 2013-02-25 DIAGNOSIS — M5416 Radiculopathy, lumbar region: Secondary | ICD-10-CM

## 2013-08-03 ENCOUNTER — Ambulatory Visit (INDEPENDENT_AMBULATORY_CARE_PROVIDER_SITE_OTHER): Payer: Medicare Other | Admitting: Cardiovascular Disease

## 2013-08-03 ENCOUNTER — Encounter: Payer: Self-pay | Admitting: Cardiovascular Disease

## 2013-08-03 VITALS — BP 118/60 | HR 80 | Ht 71.0 in | Wt 159.0 lb

## 2013-08-03 DIAGNOSIS — I2510 Atherosclerotic heart disease of native coronary artery without angina pectoris: Secondary | ICD-10-CM

## 2013-08-03 DIAGNOSIS — I959 Hypotension, unspecified: Secondary | ICD-10-CM

## 2013-08-03 DIAGNOSIS — I25119 Atherosclerotic heart disease of native coronary artery with unspecified angina pectoris: Secondary | ICD-10-CM

## 2013-08-03 DIAGNOSIS — I9589 Other hypotension: Secondary | ICD-10-CM

## 2013-08-03 DIAGNOSIS — E785 Hyperlipidemia, unspecified: Secondary | ICD-10-CM

## 2013-08-03 DIAGNOSIS — I951 Orthostatic hypotension: Secondary | ICD-10-CM

## 2013-08-03 DIAGNOSIS — I209 Angina pectoris, unspecified: Secondary | ICD-10-CM

## 2013-08-03 NOTE — Progress Notes (Signed)
HPI  This is a 80 year old male who is here today for a follow up visit. He was seen last year for preoperative cardiovascular evaluation before back fusion surgery .  He has known history of mild to moderate coronary artery disease with recurrent chest pain and suspected endothelial dysfunction. Most recent cardiac catheterization was in 2011. He reported 12-14 pound weight loss of unexplained reasons. He underwent extensive workup which has been unremarkable. He had symptomatic hypotension which required stopping Metoprolol. He continued to be symptomatic and was started on small dose Florinef with resolution of symptoms but BP became elevated and thus it was discontinued.  He underwent a treadmill myoview for chest pain which showed no evidence of ischemia with normal EF.  He underwent back surgery without complications. He uses a Florinef only as needed if systolic blood pressure is less than 100 and symptomatic. On average, he uses this once to twice a month.  He denies chest pain or dyspnea.   Allergies  Allergen Reactions  . Adhesive [Tape]     Rash and itching  . Imdur [Isosorbide Mononitrate]     Headache  . Neosporin [Neomycin-Polymyxin-Gramicidin] Rash     Current Outpatient Prescriptions on File Prior to Visit  Medication Sig Dispense Refill  . aspirin 81 MG tablet Take 81 mg by mouth daily.        . clonazePAM (KLONOPIN) 0.5 MG tablet Take 0.5 mg by mouth daily as needed for anxiety.       . fludrocortisone (FLORINEF) 0.1 MG tablet Take 0.1 mg by mouth daily as needed (low blood pressure).      . fluticasone (FLONASE) 50 MCG/ACT nasal spray Place 2 sprays into both nostrils daily.       Marland Kitchen HYDROcodone-acetaminophen (NORCO) 7.5-325 MG per tablet Take 1 tablet by mouth every 6 (six) hours as needed for moderate pain.      Marland Kitchen levothyroxine (SYNTHROID, LEVOTHROID) 100 MCG tablet Take 100 mcg by mouth daily.        . Multiple Vitamin (MULTIVITAMIN) tablet Take 1 tablet by mouth  daily.        . nitroGLYCERIN (NITROSTAT) 0.4 MG SL tablet Place 0.4 mg under the tongue every 5 (five) minutes as needed.        . simvastatin (ZOCOR) 80 MG tablet Take 80 mg by mouth at bedtime.         No current facility-administered medications on file prior to visit.     Past Medical History  Diagnosis Date  . BPH (benign prostatic hypertrophy)   . HLD (hyperlipidemia)     takes Simvastatin nightly  . Hypothyroidism     takes Synthroid daily  . Anxiety     takes Klonopin daily as needed  . Arthritis   . Joint pain   . Chronic back pain     stenosis,ddd,and HNP  . History of colon polyps   . Enlarged prostate   . Cataracts, bilateral     immature  . Insomnia     doesn't take any meds  . Hypotension      Past Surgical History  Procedure Laterality Date  . Rotator cuff repair Right 1988  . Tonsillectomy      as a child  . Back surgery  2005  . Colonoscopy    . Cardiac catheterization  11/2009    Had 3 previous cath. Most recent was in 2011. LAD: 40% mid stenosis, D1: 50 ostial , RCA: 20-30%. Normal EF  Family History  Problem Relation Age of Onset  . Cancer Mother     breast  . Heart disease    . Heart attack    . Asthma Father      History   Social History  . Marital Status: Married    Spouse Name: N/A    Number of Children: 2  . Years of Education: N/A   Occupational History  . Retired Chief Strategy Officer   Social History Main Topics  . Smoking status: Never Smoker   . Smokeless tobacco: Never Used  . Alcohol Use: No  . Drug Use: No  . Sexual Activity: Not on file   Other Topics Concern  . Not on file   Social History Narrative  . No narrative on file     PHYSICAL EXAM   BP 118/60  Pulse 80  Ht 5\' 11"  (1.803 m)  Wt 159 lb (72.122 kg)  BMI 22.19 kg/m2 Constitutional: He is oriented to person, place, and time. He appears well-developed and well-nourished. No distress.  HENT: No nasal discharge.  Head: Normocephalic and atraumatic.  Eyes:  Pupils are equal and round.  No discharge. Neck: Normal range of motion. Neck supple. No JVD present. No thyromegaly present.  Cardiovascular: Normal rate, regular rhythm, normal heart sounds. Exam reveals no gallop and no friction rub. No murmur heard.  Pulmonary/Chest: Effort normal and breath sounds normal. No stridor. No respiratory distress. He has no wheezes. He has no rales. He exhibits no tenderness.  Abdominal: Soft. Bowel sounds are normal. He exhibits no distension. There is no tenderness. There is no rebound and no guarding.  Musculoskeletal: Normal range of motion. He exhibits no edema and no tenderness.  Neurological: He is alert and oriented to person, place, and time. Coordination normal.  Skin: Skin is warm and dry. No rash noted. He is not diaphoretic. No erythema. No pallor.  Psychiatric: He has a normal mood and affect. His behavior is normal. Judgment and thought content normal.        ASSESSMENT AND PLAN

## 2013-08-03 NOTE — Assessment & Plan Note (Signed)
This resolved after increasing fluid and salt intake. Continue to use Florinef only as needed if systolic blood pressure is less than 100.

## 2013-08-03 NOTE — Patient Instructions (Signed)
Your physician wants you to follow-up in:  12 months.  You will receive a reminder letter in the mail two months in advance. If you don't receive a letter, please call our office to schedule the follow-up appointment.   

## 2013-08-03 NOTE — Assessment & Plan Note (Signed)
He is doing well with no symptoms suggestive of angina. Continue medical therapy. Nuclear stress test last year was normal.

## 2013-08-03 NOTE — Assessment & Plan Note (Signed)
Continue treatment with simvastatin with a target LDL of less than 100. 

## 2014-01-24 ENCOUNTER — Other Ambulatory Visit: Payer: Self-pay | Admitting: Neurosurgery

## 2014-01-24 DIAGNOSIS — S32009A Unspecified fracture of unspecified lumbar vertebra, initial encounter for closed fracture: Secondary | ICD-10-CM

## 2014-02-21 ENCOUNTER — Ambulatory Visit
Admission: RE | Admit: 2014-02-21 | Discharge: 2014-02-21 | Disposition: A | Discharge status: Home / Self Care | Payer: Medicare Other | Source: Ambulatory Visit | Attending: Neurosurgery | Admitting: Neurosurgery

## 2014-02-21 DIAGNOSIS — S32009A Unspecified fracture of unspecified lumbar vertebra, initial encounter for closed fracture: Secondary | ICD-10-CM

## 2014-02-21 DIAGNOSIS — S32029K Unspecified fracture of second lumbar vertebra, subsequent encounter for fracture with nonunion: Secondary | ICD-10-CM | POA: Diagnosis not present

## 2014-02-25 DIAGNOSIS — M5136 Other intervertebral disc degeneration, lumbar region: Secondary | ICD-10-CM | POA: Diagnosis not present

## 2014-02-25 DIAGNOSIS — M545 Low back pain: Secondary | ICD-10-CM | POA: Diagnosis not present

## 2014-02-25 DIAGNOSIS — M47816 Spondylosis without myelopathy or radiculopathy, lumbar region: Secondary | ICD-10-CM | POA: Diagnosis not present

## 2014-02-25 DIAGNOSIS — S32038G Other fracture of third lumbar vertebra, subsequent encounter for fracture with delayed healing: Secondary | ICD-10-CM | POA: Diagnosis not present

## 2014-02-25 DIAGNOSIS — M419 Scoliosis, unspecified: Secondary | ICD-10-CM | POA: Diagnosis not present

## 2014-03-11 DIAGNOSIS — F329 Major depressive disorder, single episode, unspecified: Secondary | ICD-10-CM | POA: Diagnosis not present

## 2014-03-11 DIAGNOSIS — I2510 Atherosclerotic heart disease of native coronary artery without angina pectoris: Secondary | ICD-10-CM | POA: Diagnosis not present

## 2014-03-11 DIAGNOSIS — I10 Essential (primary) hypertension: Secondary | ICD-10-CM | POA: Diagnosis not present

## 2014-03-11 DIAGNOSIS — J309 Allergic rhinitis, unspecified: Secondary | ICD-10-CM | POA: Diagnosis not present

## 2014-03-11 DIAGNOSIS — F419 Anxiety disorder, unspecified: Secondary | ICD-10-CM | POA: Diagnosis not present

## 2014-03-11 DIAGNOSIS — E039 Hypothyroidism, unspecified: Secondary | ICD-10-CM | POA: Diagnosis not present

## 2014-03-11 DIAGNOSIS — E780 Pure hypercholesterolemia: Secondary | ICD-10-CM | POA: Diagnosis not present

## 2014-03-11 DIAGNOSIS — J209 Acute bronchitis, unspecified: Secondary | ICD-10-CM | POA: Diagnosis not present

## 2014-03-18 DIAGNOSIS — E780 Pure hypercholesterolemia: Secondary | ICD-10-CM | POA: Diagnosis not present

## 2014-03-18 DIAGNOSIS — J209 Acute bronchitis, unspecified: Secondary | ICD-10-CM | POA: Diagnosis not present

## 2014-03-18 DIAGNOSIS — Z1389 Encounter for screening for other disorder: Secondary | ICD-10-CM | POA: Diagnosis not present

## 2014-03-18 DIAGNOSIS — I10 Essential (primary) hypertension: Secondary | ICD-10-CM | POA: Diagnosis not present

## 2014-03-18 DIAGNOSIS — Z9181 History of falling: Secondary | ICD-10-CM | POA: Diagnosis not present

## 2014-03-18 DIAGNOSIS — I2510 Atherosclerotic heart disease of native coronary artery without angina pectoris: Secondary | ICD-10-CM | POA: Diagnosis not present

## 2014-04-03 IMAGING — CR DG CHEST 2V
2 series · 2 of 2 positions shown · non-contrast
Comparison: 02/14/2011

CLINICAL DATA: Preop for lumbar spine surgery.

EXAM:
CHEST  2 VIEW

[w chest pa]
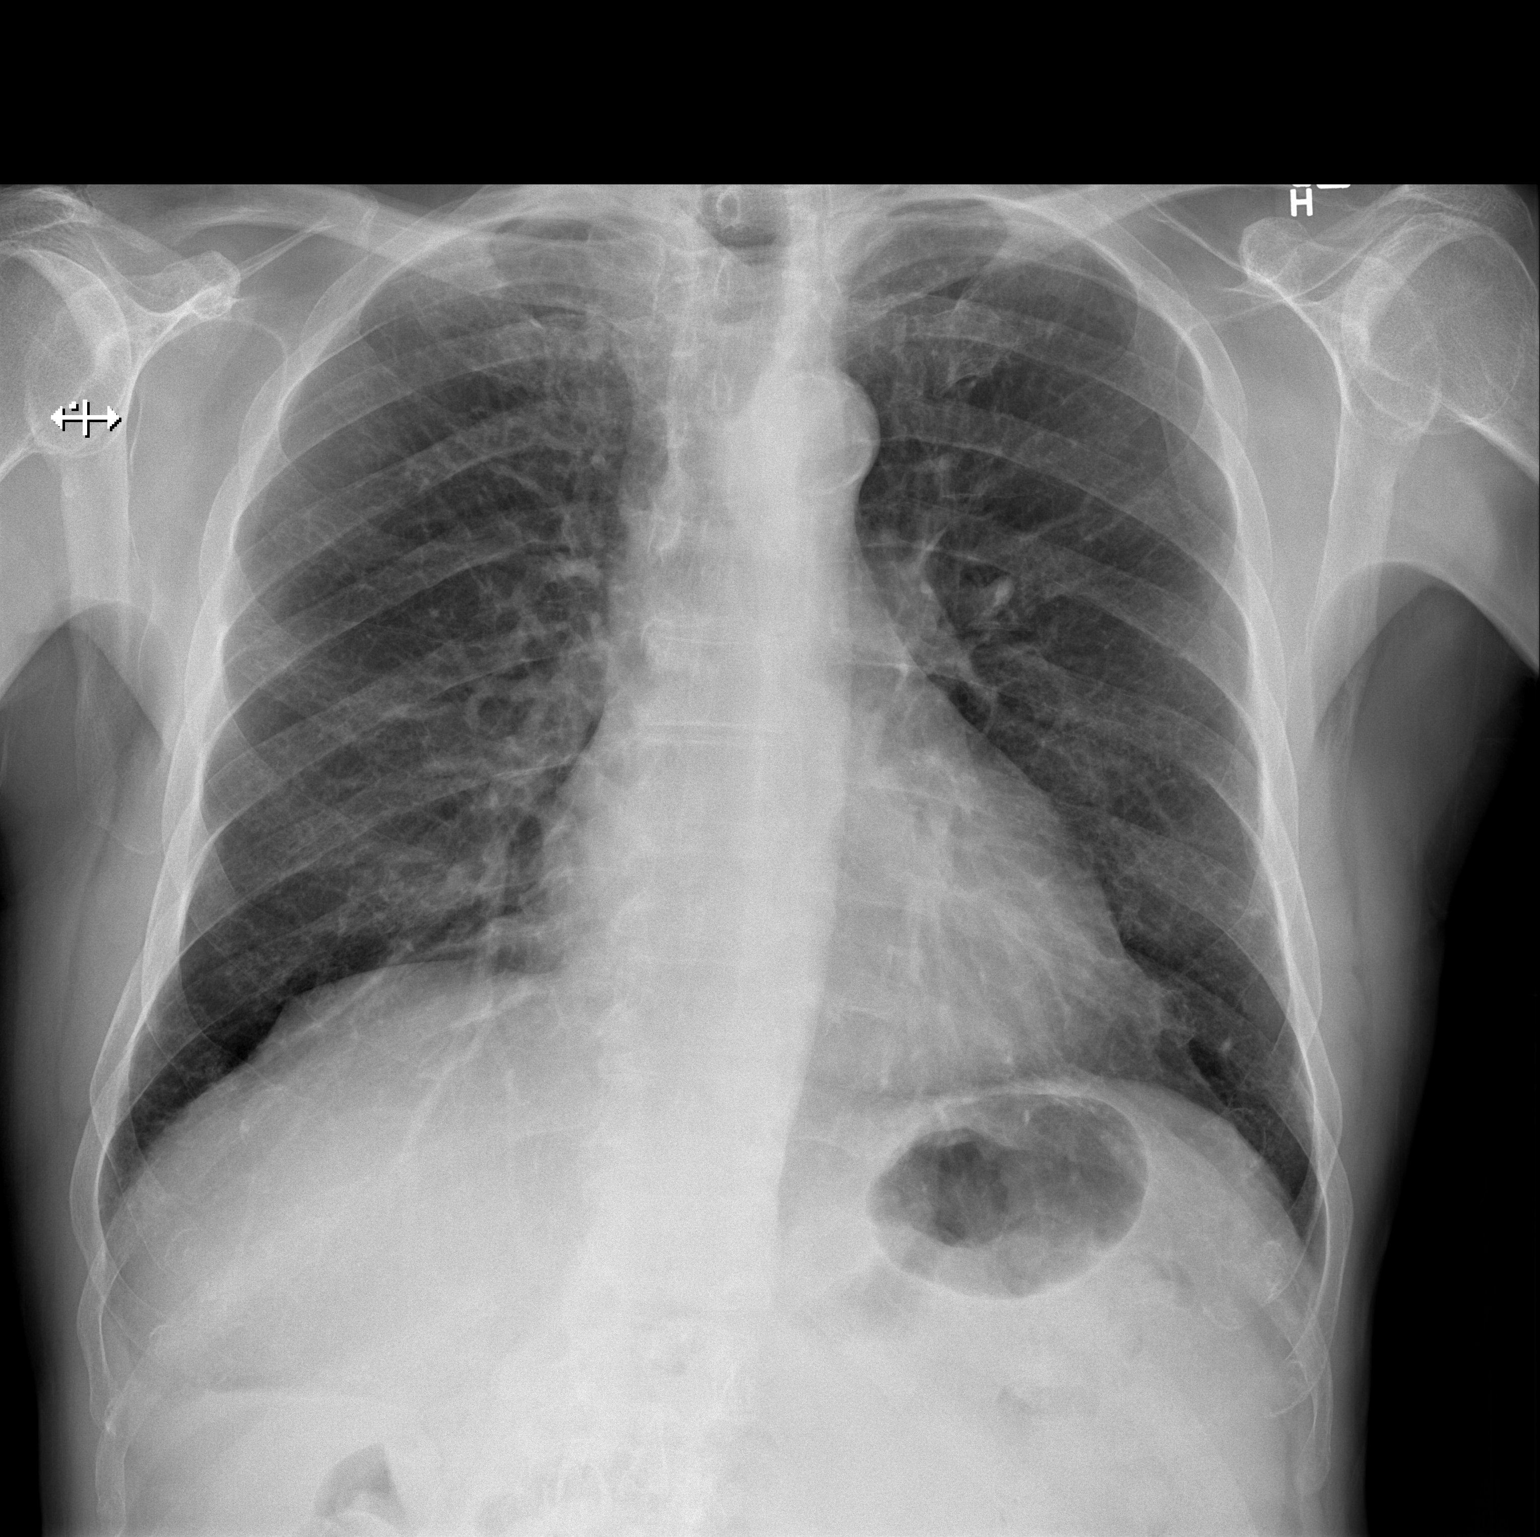

[w chest lat]
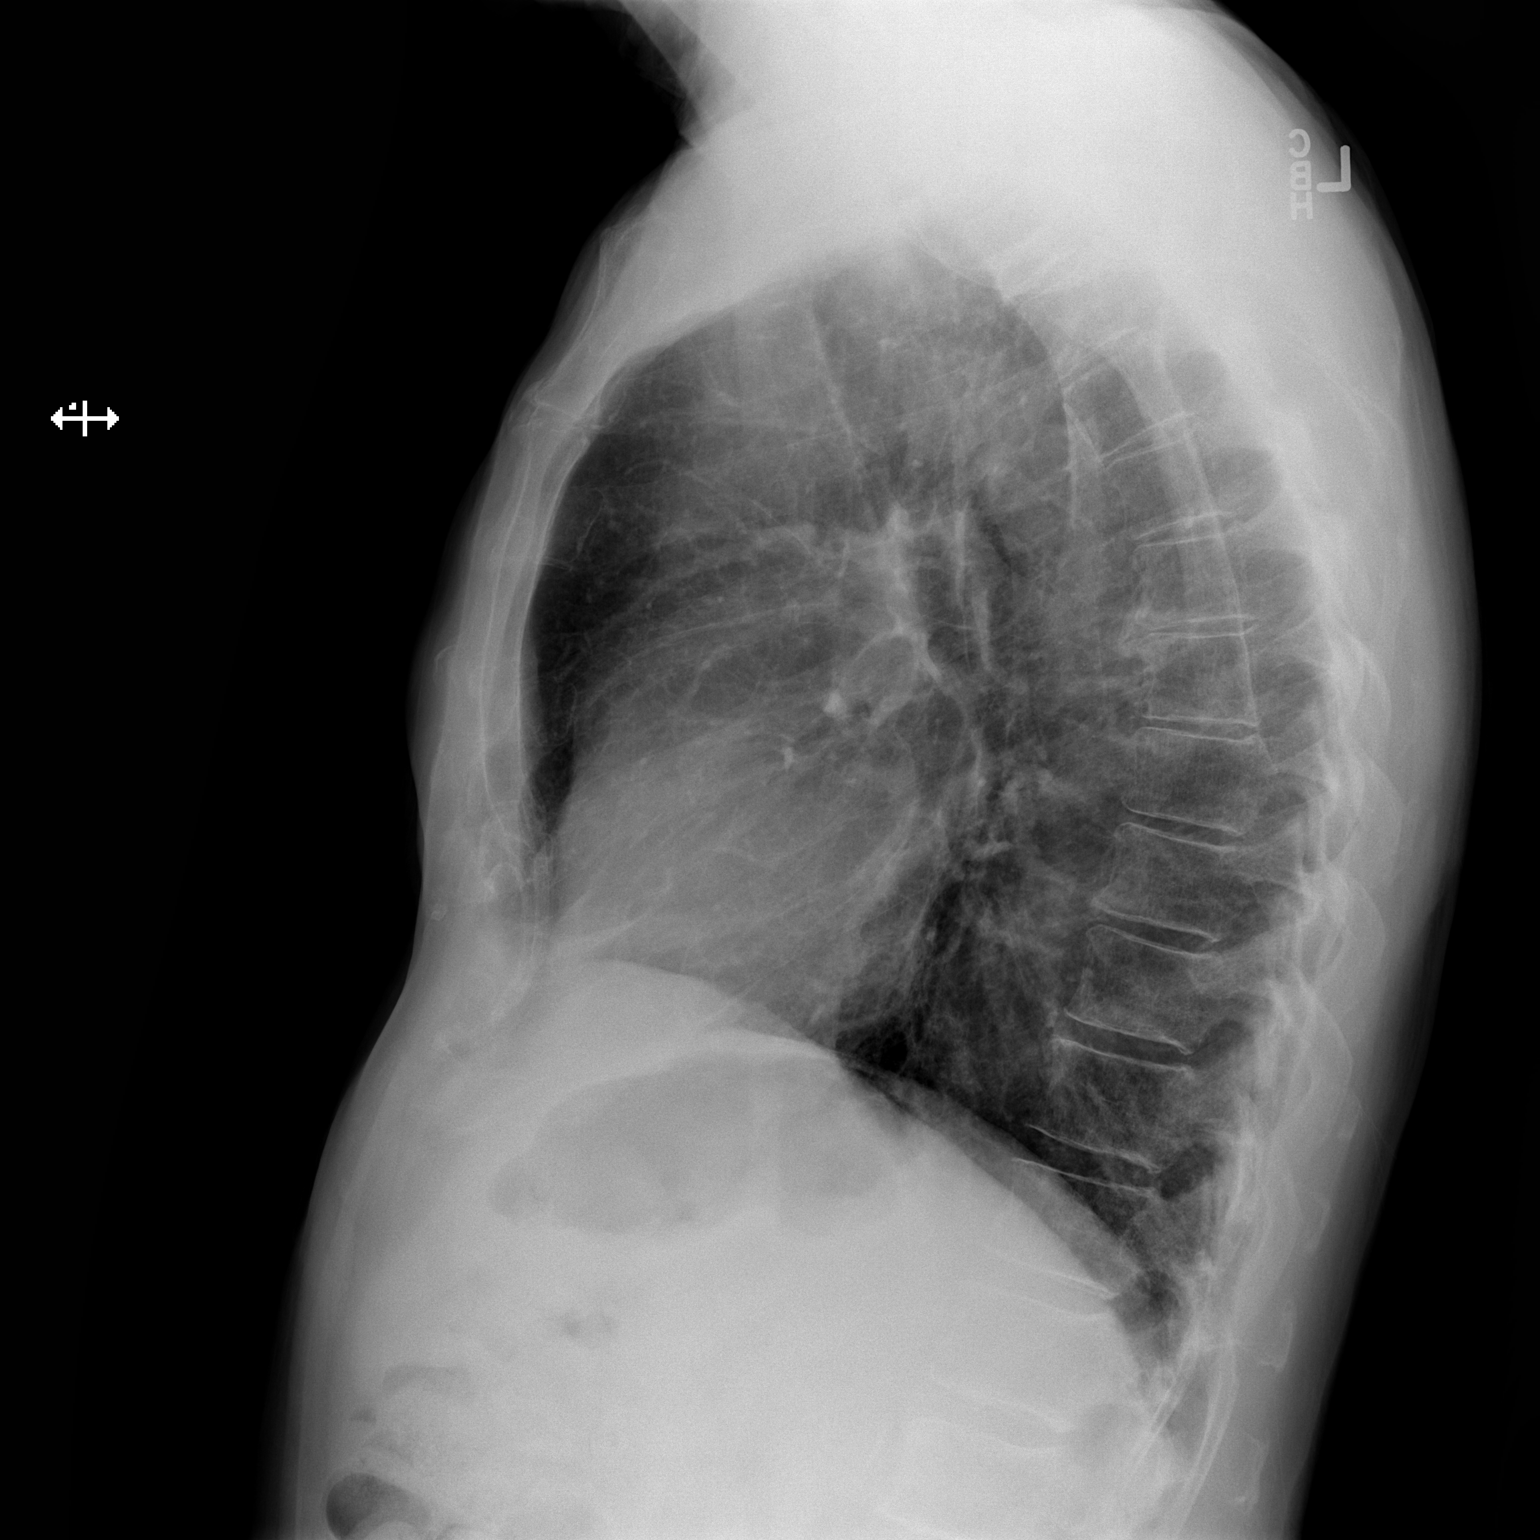

[2 of 2 positions shown; findings below may reference images not displayed]

FINDINGS: Heart size is upper limits normal. The lungs are free of focal
consolidations and pleural effusions. Mildly coarsened interstitial
markings are probably chronic. No focal consolidations or pleural
effusions. Visualized osseous structures have a normal appearance.
No evidence for acute cardiopulmonary abnormality.
IMPRESSION: No active cardiopulmonary disease.

## 2014-04-15 DIAGNOSIS — J209 Acute bronchitis, unspecified: Secondary | ICD-10-CM | POA: Diagnosis not present

## 2014-04-15 DIAGNOSIS — E780 Pure hypercholesterolemia: Secondary | ICD-10-CM | POA: Diagnosis not present

## 2014-04-15 DIAGNOSIS — J309 Allergic rhinitis, unspecified: Secondary | ICD-10-CM | POA: Diagnosis not present

## 2014-04-15 DIAGNOSIS — I10 Essential (primary) hypertension: Secondary | ICD-10-CM | POA: Diagnosis not present

## 2014-04-15 DIAGNOSIS — I2510 Atherosclerotic heart disease of native coronary artery without angina pectoris: Secondary | ICD-10-CM | POA: Diagnosis not present

## 2014-04-27 DIAGNOSIS — H2513 Age-related nuclear cataract, bilateral: Secondary | ICD-10-CM | POA: Diagnosis not present

## 2014-04-27 DIAGNOSIS — H5203 Hypermetropia, bilateral: Secondary | ICD-10-CM | POA: Diagnosis not present

## 2014-04-27 DIAGNOSIS — H35033 Hypertensive retinopathy, bilateral: Secondary | ICD-10-CM | POA: Diagnosis not present

## 2014-04-27 DIAGNOSIS — H34831 Tributary (branch) retinal vein occlusion, right eye: Secondary | ICD-10-CM | POA: Diagnosis not present

## 2014-04-27 DIAGNOSIS — H25013 Cortical age-related cataract, bilateral: Secondary | ICD-10-CM | POA: Diagnosis not present

## 2014-05-09 DIAGNOSIS — H25013 Cortical age-related cataract, bilateral: Secondary | ICD-10-CM | POA: Diagnosis not present

## 2014-05-09 DIAGNOSIS — H2513 Age-related nuclear cataract, bilateral: Secondary | ICD-10-CM | POA: Diagnosis not present

## 2014-05-09 DIAGNOSIS — H34831 Tributary (branch) retinal vein occlusion, right eye: Secondary | ICD-10-CM | POA: Diagnosis not present

## 2014-05-23 ENCOUNTER — Telehealth: Payer: Self-pay | Admitting: Cardiovascular Disease

## 2014-05-23 NOTE — Telephone Encounter (Signed)
New message     Need date of last EKG

## 2014-05-23 NOTE — Telephone Encounter (Signed)
Needed to know the date of the last EKG (12/01/2012) and last OV (08/03/13).  Pt getting ready to have cataract surgery.  They will call back with further questions or needs.

## 2014-05-24 ENCOUNTER — Telehealth: Payer: Self-pay | Admitting: Cardiovascular Disease

## 2014-05-24 NOTE — Telephone Encounter (Signed)
New message  Pt wife called to discuss 05/03 appt. She states that she has paper work that she would like to go over with Dr. Tyrell Antonio nurse. She would also like to discuss retrieving a sooner appt. Please call

## 2014-05-24 NOTE — Telephone Encounter (Signed)
Pt daughter called as dad is scheduled for cataract surgery on 5/4 with [facility] in [facility], Dr. Wayne Both. Pt has Pre-Op appointment on 4/18.  Dr. Rex Kras states pt needs an updated EKG, last one in our systems is 11/2012, and clearance from her cardiologist.  Pt is wanting to see if can get if we can schedule earlier appointment to keep schedule surgery or do they need to move it?  Pt daughter made aware there are no sooner appointments with Dr. Fletcher Anon at this time.  Per pt daughter [facility] is sending cardiac clearance paperwork to our office to complete.

## 2014-05-25 DIAGNOSIS — E46 Unspecified protein-calorie malnutrition: Secondary | ICD-10-CM | POA: Diagnosis not present

## 2014-05-25 DIAGNOSIS — Z01810 Encounter for preprocedural cardiovascular examination: Secondary | ICD-10-CM | POA: Diagnosis not present

## 2014-05-25 DIAGNOSIS — E780 Pure hypercholesterolemia: Secondary | ICD-10-CM | POA: Diagnosis not present

## 2014-05-25 DIAGNOSIS — I10 Essential (primary) hypertension: Secondary | ICD-10-CM | POA: Diagnosis not present

## 2014-05-25 DIAGNOSIS — I2510 Atherosclerotic heart disease of native coronary artery without angina pectoris: Secondary | ICD-10-CM | POA: Diagnosis not present

## 2014-06-14 ENCOUNTER — Ambulatory Visit: Payer: Medicare Other | Admitting: Cardiovascular Disease

## 2014-06-15 DIAGNOSIS — H25012 Cortical age-related cataract, left eye: Secondary | ICD-10-CM | POA: Diagnosis not present

## 2014-06-15 DIAGNOSIS — H25013 Cortical age-related cataract, bilateral: Secondary | ICD-10-CM | POA: Diagnosis not present

## 2014-06-15 DIAGNOSIS — H2512 Age-related nuclear cataract, left eye: Secondary | ICD-10-CM | POA: Diagnosis not present

## 2014-06-15 DIAGNOSIS — H2181 Floppy iris syndrome: Secondary | ICD-10-CM | POA: Diagnosis not present

## 2014-06-15 DIAGNOSIS — H2513 Age-related nuclear cataract, bilateral: Secondary | ICD-10-CM | POA: Diagnosis not present

## 2014-06-15 DIAGNOSIS — H5703 Miosis: Secondary | ICD-10-CM | POA: Diagnosis not present

## 2014-06-16 DIAGNOSIS — H25011 Cortical age-related cataract, right eye: Secondary | ICD-10-CM | POA: Diagnosis not present

## 2014-06-16 DIAGNOSIS — H2511 Age-related nuclear cataract, right eye: Secondary | ICD-10-CM | POA: Diagnosis not present

## 2014-06-17 DIAGNOSIS — E780 Pure hypercholesterolemia: Secondary | ICD-10-CM | POA: Diagnosis not present

## 2014-06-17 DIAGNOSIS — M25552 Pain in left hip: Secondary | ICD-10-CM | POA: Diagnosis not present

## 2014-06-17 DIAGNOSIS — Z9181 History of falling: Secondary | ICD-10-CM | POA: Diagnosis not present

## 2014-06-17 DIAGNOSIS — I10 Essential (primary) hypertension: Secondary | ICD-10-CM | POA: Diagnosis not present

## 2014-06-17 DIAGNOSIS — I2510 Atherosclerotic heart disease of native coronary artery without angina pectoris: Secondary | ICD-10-CM | POA: Diagnosis not present

## 2014-07-14 DIAGNOSIS — H2511 Age-related nuclear cataract, right eye: Secondary | ICD-10-CM | POA: Diagnosis not present

## 2014-07-14 DIAGNOSIS — H25011 Cortical age-related cataract, right eye: Secondary | ICD-10-CM | POA: Diagnosis not present

## 2014-07-27 DIAGNOSIS — H25011 Cortical age-related cataract, right eye: Secondary | ICD-10-CM | POA: Diagnosis not present

## 2014-07-27 DIAGNOSIS — H2511 Age-related nuclear cataract, right eye: Secondary | ICD-10-CM | POA: Diagnosis not present

## 2014-07-27 DIAGNOSIS — H2181 Floppy iris syndrome: Secondary | ICD-10-CM | POA: Diagnosis not present

## 2014-07-27 DIAGNOSIS — H578 Other specified disorders of eye and adnexa: Secondary | ICD-10-CM | POA: Diagnosis not present

## 2014-07-27 DIAGNOSIS — H5703 Miosis: Secondary | ICD-10-CM | POA: Diagnosis not present

## 2014-08-17 DIAGNOSIS — E780 Pure hypercholesterolemia: Secondary | ICD-10-CM | POA: Diagnosis not present

## 2014-08-17 DIAGNOSIS — J309 Allergic rhinitis, unspecified: Secondary | ICD-10-CM | POA: Diagnosis not present

## 2014-08-17 DIAGNOSIS — I2510 Atherosclerotic heart disease of native coronary artery without angina pectoris: Secondary | ICD-10-CM | POA: Diagnosis not present

## 2014-08-17 DIAGNOSIS — J209 Acute bronchitis, unspecified: Secondary | ICD-10-CM | POA: Diagnosis not present

## 2014-08-17 DIAGNOSIS — I10 Essential (primary) hypertension: Secondary | ICD-10-CM | POA: Diagnosis not present

## 2014-08-26 DIAGNOSIS — M47816 Spondylosis without myelopathy or radiculopathy, lumbar region: Secondary | ICD-10-CM | POA: Diagnosis not present

## 2014-08-26 DIAGNOSIS — M545 Low back pain: Secondary | ICD-10-CM | POA: Diagnosis not present

## 2014-08-26 DIAGNOSIS — Z981 Arthrodesis status: Secondary | ICD-10-CM | POA: Diagnosis not present

## 2014-08-26 DIAGNOSIS — S32038G Other fracture of third lumbar vertebra, subsequent encounter for fracture with delayed healing: Secondary | ICD-10-CM | POA: Diagnosis not present

## 2014-08-26 DIAGNOSIS — M419 Scoliosis, unspecified: Secondary | ICD-10-CM | POA: Diagnosis not present

## 2014-08-30 ENCOUNTER — Ambulatory Visit (INDEPENDENT_AMBULATORY_CARE_PROVIDER_SITE_OTHER): Payer: Medicare Other | Admitting: Cardiovascular Disease

## 2014-08-30 ENCOUNTER — Encounter: Payer: Self-pay | Admitting: Cardiovascular Disease

## 2014-08-30 ENCOUNTER — Other Ambulatory Visit: Payer: Self-pay

## 2014-08-30 VITALS — BP 100/60 | HR 79 | Ht 71.0 in | Wt 160.0 lb

## 2014-08-30 DIAGNOSIS — I2510 Atherosclerotic heart disease of native coronary artery without angina pectoris: Secondary | ICD-10-CM

## 2014-08-30 DIAGNOSIS — I959 Hypotension, unspecified: Secondary | ICD-10-CM

## 2014-08-30 DIAGNOSIS — I9589 Other hypotension: Secondary | ICD-10-CM

## 2014-08-30 DIAGNOSIS — E785 Hyperlipidemia, unspecified: Secondary | ICD-10-CM

## 2014-08-30 MED ORDER — FLUDROCORTISONE ACETATE 0.1 MG PO TABS: 0.1000 mg | ORAL_TABLET | Freq: Every day | ORAL | Status: DC | PRN

## 2014-08-30 MED ORDER — NITROGLYCERIN 0.4 MG SL SUBL: 0.4000 mg | SUBLINGUAL_TABLET | SUBLINGUAL | Status: AC | PRN

## 2014-08-30 NOTE — Progress Notes (Signed)
HPI  This is a 82year-old male who is here today for a follow up visit.  He has known history of mild to moderate coronary artery disease with recurrent chest pain and suspected endothelial dysfunction. Most recent cardiac catheterization was in 2011.  He has known history of symptomatic orthostatic hypotension which required stopping Metoprolol. He continued to be symptomatic and was started on small dose Florinef with resolution of symptoms but BP became elevated and thus it was discontinued.  He underwent a treadmill myoview in 11/2012 for chest pain which showed no evidence of ischemia with normal EF.  He had back surgery in 6295 without complications. He underwent cataract surgery in May. He uses Florinef only as needed when his blood pressure is low but that does not happen very frequently. Otherwise she has been doing well.   Allergies  Allergen Reactions  . Adhesive [Tape] Rash    Must use paper tape Rash and itching  . Imdur [Isosorbide Mononitrate]     Headache  . Neosporin [Neomycin-Polymyxin-Gramicidin] Rash     Current Outpatient Prescriptions on File Prior to Visit  Medication Sig Dispense Refill  . aspirin 81 MG tablet Take 81 mg by mouth daily.      . clonazePAM (KLONOPIN) 0.5 MG tablet Take 0.5 mg by mouth daily as needed for anxiety.     . fludrocortisone (FLORINEF) 0.1 MG tablet Take 0.1 mg by mouth daily as needed (low blood pressure).    . fluticasone (FLONASE) 50 MCG/ACT nasal spray Place 2 sprays into both nostrils daily.     Marland Kitchen ibuprofen (ADVIL,MOTRIN) 100 MG tablet Take 100 mg by mouth every 6 (six) hours as needed for pain.    Marland Kitchen levothyroxine (SYNTHROID, LEVOTHROID) 100 MCG tablet Take 100 mcg by mouth daily.      . Multiple Vitamin (MULTIVITAMIN) tablet Take 1 tablet by mouth daily.      . nitroGLYCERIN (NITROSTAT) 0.4 MG SL tablet Place 0.4 mg under the tongue every 5 (five) minutes as needed for chest pain.     . simvastatin (ZOCOR) 80 MG tablet Take 80 mg  by mouth at bedtime.       No current facility-administered medications on file prior to visit.     Past Medical History  Diagnosis Date  . BPH (benign prostatic hypertrophy)   . HLD (hyperlipidemia)     takes Simvastatin nightly  . Hypothyroidism     takes Synthroid daily  . Anxiety     takes Klonopin daily as needed  . Arthritis   . Joint pain   . Chronic back pain     stenosis,ddd,and HNP  . History of colon polyps   . Enlarged prostate   . Cataracts, bilateral     immature  . Insomnia     doesn't take any meds  . Hypotension      Past Surgical History  Procedure Laterality Date  . Rotator cuff repair Right 1988  . Tonsillectomy      as a child  . Back surgery  2005  . Colonoscopy    . Cardiac catheterization  11/2009    Had 3 previous cath. Most recent was in 2011. LAD: 40% mid stenosis, D1: 50 ostial , RCA: 20-30%. Normal EF     Family History  Problem Relation Age of Onset  . Cancer Mother     breast  . Heart disease    . Heart attack    . Asthma Father   . Hypertension Mother   .  Stroke Neg Hx   . Heart attack Father   . Heart attack Maternal Uncle   . Heart attack Maternal Grandfather      History   Social History  . Marital Status: Married    Spouse Name: N/A  . Number of Children: 2  . Years of Education: N/A   Occupational History  . Retired Chief Strategy Officer   Social History Main Topics  . Smoking status: Never Smoker   . Smokeless tobacco: Never Used  . Alcohol Use: No  . Drug Use: No  . Sexual Activity: Not on file   Other Topics Concern  . Not on file   Social History Narrative     PHYSICAL EXAM   BP 100/60 mmHg  Pulse 79  Ht 5\' 11"  (1.803 m)  Wt 160 lb (72.576 kg)  BMI 22.33 kg/m2  SpO2 98% Constitutional: He is oriented to person, place, and time. He appears well-developed and well-nourished. No distress.  HENT: No nasal discharge.  Head: Normocephalic and atraumatic.  Eyes: Pupils are equal and round.  No  discharge. Neck: Normal range of motion. Neck supple. No JVD present. No thyromegaly present.  Cardiovascular: Normal rate, regular rhythm, normal heart sounds. Exam reveals no gallop and no friction rub. No murmur heard.  Pulmonary/Chest: Effort normal and breath sounds normal. No stridor. No respiratory distress. He has no wheezes. He has no rales. He exhibits no tenderness.  Abdominal: Soft. Bowel sounds are normal. He exhibits no distension. There is no tenderness. There is no rebound and no guarding.  Musculoskeletal: Normal range of motion. He exhibits no edema and no tenderness.  Neurological: He is alert and oriented to person, place, and time. Coordination normal.  Skin: Skin is warm and dry. No rash noted. He is not diaphoretic. No erythema. No pallor.  Psychiatric: He has a normal mood and affect. His behavior is normal. Judgment and thought content normal.        ASSESSMENT AND PLAN

## 2014-08-30 NOTE — Assessment & Plan Note (Signed)
He has been tolerating high-dose simvastatin but should consider decreasing the dose to 40 mg or switching to a different statin.

## 2014-08-30 NOTE — Assessment & Plan Note (Signed)
This was mild to moderate by cardiac catheterization. Nuclear stress test in 2014 was normal. Continue medical therapy.

## 2014-08-30 NOTE — Patient Instructions (Signed)
Medication Instructions:  Your physician recommends that you continue on your current medications as directed. Please refer to the Current Medication list given to you today.  Labwork: No new orders.   Testing/Procedures: No new orders.   Follow-Up: Your physician wants you to follow-up in: 1 YEAR with Dr Arida.  You will receive a reminder letter in the mail two months in advance. If you don't receive a letter, please call our office to schedule the follow-up appointment.   Any Other Special Instructions Will Be Listed Below (If Applicable).   

## 2014-08-30 NOTE — Assessment & Plan Note (Signed)
Avoid volume depletion and continue to use Florinef as needed.

## 2014-09-01 DIAGNOSIS — H52203 Unspecified astigmatism, bilateral: Secondary | ICD-10-CM | POA: Diagnosis not present

## 2014-09-02 DIAGNOSIS — E780 Pure hypercholesterolemia: Secondary | ICD-10-CM | POA: Diagnosis not present

## 2014-09-02 DIAGNOSIS — I10 Essential (primary) hypertension: Secondary | ICD-10-CM | POA: Diagnosis not present

## 2014-09-02 DIAGNOSIS — J209 Acute bronchitis, unspecified: Secondary | ICD-10-CM | POA: Diagnosis not present

## 2014-09-02 DIAGNOSIS — J309 Allergic rhinitis, unspecified: Secondary | ICD-10-CM | POA: Diagnosis not present

## 2014-09-02 DIAGNOSIS — I2510 Atherosclerotic heart disease of native coronary artery without angina pectoris: Secondary | ICD-10-CM | POA: Diagnosis not present

## 2014-09-16 DIAGNOSIS — F419 Anxiety disorder, unspecified: Secondary | ICD-10-CM | POA: Diagnosis not present

## 2014-09-16 DIAGNOSIS — E039 Hypothyroidism, unspecified: Secondary | ICD-10-CM | POA: Diagnosis not present

## 2014-09-16 DIAGNOSIS — E780 Pure hypercholesterolemia: Secondary | ICD-10-CM | POA: Diagnosis not present

## 2014-09-16 DIAGNOSIS — J309 Allergic rhinitis, unspecified: Secondary | ICD-10-CM | POA: Diagnosis not present

## 2014-09-16 DIAGNOSIS — I2510 Atherosclerotic heart disease of native coronary artery without angina pectoris: Secondary | ICD-10-CM | POA: Diagnosis not present

## 2014-09-16 DIAGNOSIS — I10 Essential (primary) hypertension: Secondary | ICD-10-CM | POA: Diagnosis not present

## 2014-10-06 DIAGNOSIS — Z961 Presence of intraocular lens: Secondary | ICD-10-CM | POA: Diagnosis not present

## 2014-10-31 DIAGNOSIS — I2510 Atherosclerotic heart disease of native coronary artery without angina pectoris: Secondary | ICD-10-CM | POA: Diagnosis not present

## 2014-10-31 DIAGNOSIS — E780 Pure hypercholesterolemia: Secondary | ICD-10-CM | POA: Diagnosis not present

## 2014-10-31 DIAGNOSIS — M25552 Pain in left hip: Secondary | ICD-10-CM | POA: Diagnosis not present

## 2014-10-31 DIAGNOSIS — I10 Essential (primary) hypertension: Secondary | ICD-10-CM | POA: Diagnosis not present

## 2014-10-31 DIAGNOSIS — M545 Low back pain: Secondary | ICD-10-CM | POA: Diagnosis not present

## 2014-11-07 DIAGNOSIS — I2510 Atherosclerotic heart disease of native coronary artery without angina pectoris: Secondary | ICD-10-CM | POA: Diagnosis not present

## 2014-11-07 DIAGNOSIS — M25552 Pain in left hip: Secondary | ICD-10-CM | POA: Diagnosis not present

## 2014-11-07 DIAGNOSIS — M5416 Radiculopathy, lumbar region: Secondary | ICD-10-CM | POA: Diagnosis not present

## 2014-11-07 DIAGNOSIS — E780 Pure hypercholesterolemia: Secondary | ICD-10-CM | POA: Diagnosis not present

## 2014-11-07 DIAGNOSIS — I10 Essential (primary) hypertension: Secondary | ICD-10-CM | POA: Diagnosis not present

## 2014-12-09 DIAGNOSIS — Z23 Encounter for immunization: Secondary | ICD-10-CM | POA: Diagnosis not present

## 2014-12-23 DIAGNOSIS — I10 Essential (primary) hypertension: Secondary | ICD-10-CM | POA: Diagnosis not present

## 2014-12-23 DIAGNOSIS — I2510 Atherosclerotic heart disease of native coronary artery without angina pectoris: Secondary | ICD-10-CM | POA: Diagnosis not present

## 2014-12-23 DIAGNOSIS — M25552 Pain in left hip: Secondary | ICD-10-CM | POA: Diagnosis not present

## 2014-12-23 DIAGNOSIS — Z1389 Encounter for screening for other disorder: Secondary | ICD-10-CM | POA: Diagnosis not present

## 2014-12-23 DIAGNOSIS — Z9181 History of falling: Secondary | ICD-10-CM | POA: Diagnosis not present

## 2014-12-23 DIAGNOSIS — E7800 Pure hypercholesterolemia, unspecified: Secondary | ICD-10-CM | POA: Diagnosis not present

## 2015-04-07 DIAGNOSIS — J309 Allergic rhinitis, unspecified: Secondary | ICD-10-CM | POA: Diagnosis not present

## 2015-04-07 DIAGNOSIS — I10 Essential (primary) hypertension: Secondary | ICD-10-CM | POA: Diagnosis not present

## 2015-04-07 DIAGNOSIS — E7800 Pure hypercholesterolemia, unspecified: Secondary | ICD-10-CM | POA: Diagnosis not present

## 2015-04-07 DIAGNOSIS — I2510 Atherosclerotic heart disease of native coronary artery without angina pectoris: Secondary | ICD-10-CM | POA: Diagnosis not present

## 2015-04-07 DIAGNOSIS — E039 Hypothyroidism, unspecified: Secondary | ICD-10-CM | POA: Diagnosis not present

## 2015-04-07 DIAGNOSIS — M25552 Pain in left hip: Secondary | ICD-10-CM | POA: Diagnosis not present

## 2015-04-22 DIAGNOSIS — I10 Essential (primary) hypertension: Secondary | ICD-10-CM | POA: Diagnosis not present

## 2015-04-22 DIAGNOSIS — L309 Dermatitis, unspecified: Secondary | ICD-10-CM | POA: Diagnosis not present

## 2015-04-22 DIAGNOSIS — G309 Alzheimer's disease, unspecified: Secondary | ICD-10-CM | POA: Diagnosis not present

## 2015-04-22 DIAGNOSIS — R229 Localized swelling, mass and lump, unspecified: Secondary | ICD-10-CM | POA: Diagnosis not present

## 2015-04-22 DIAGNOSIS — E7800 Pure hypercholesterolemia, unspecified: Secondary | ICD-10-CM | POA: Diagnosis not present

## 2015-05-05 DIAGNOSIS — M25552 Pain in left hip: Secondary | ICD-10-CM | POA: Diagnosis not present

## 2015-05-05 DIAGNOSIS — J309 Allergic rhinitis, unspecified: Secondary | ICD-10-CM | POA: Diagnosis not present

## 2015-05-05 DIAGNOSIS — E7800 Pure hypercholesterolemia, unspecified: Secondary | ICD-10-CM | POA: Diagnosis not present

## 2015-05-05 DIAGNOSIS — I10 Essential (primary) hypertension: Secondary | ICD-10-CM | POA: Diagnosis not present

## 2015-05-05 DIAGNOSIS — I2510 Atherosclerotic heart disease of native coronary artery without angina pectoris: Secondary | ICD-10-CM | POA: Diagnosis not present

## 2015-05-17 DIAGNOSIS — C44319 Basal cell carcinoma of skin of other parts of face: Secondary | ICD-10-CM | POA: Diagnosis not present

## 2015-05-17 DIAGNOSIS — D0439 Carcinoma in situ of skin of other parts of face: Secondary | ICD-10-CM | POA: Diagnosis not present

## 2015-07-07 DIAGNOSIS — E7800 Pure hypercholesterolemia, unspecified: Secondary | ICD-10-CM | POA: Diagnosis not present

## 2015-07-07 DIAGNOSIS — I2510 Atherosclerotic heart disease of native coronary artery without angina pectoris: Secondary | ICD-10-CM | POA: Diagnosis not present

## 2015-07-07 DIAGNOSIS — I10 Essential (primary) hypertension: Secondary | ICD-10-CM | POA: Diagnosis not present

## 2015-07-07 DIAGNOSIS — E039 Hypothyroidism, unspecified: Secondary | ICD-10-CM | POA: Diagnosis not present

## 2015-07-07 DIAGNOSIS — J309 Allergic rhinitis, unspecified: Secondary | ICD-10-CM | POA: Diagnosis not present

## 2015-07-20 DIAGNOSIS — I10 Essential (primary) hypertension: Secondary | ICD-10-CM | POA: Diagnosis not present

## 2015-07-20 DIAGNOSIS — J209 Acute bronchitis, unspecified: Secondary | ICD-10-CM | POA: Diagnosis not present

## 2015-07-20 DIAGNOSIS — J309 Allergic rhinitis, unspecified: Secondary | ICD-10-CM | POA: Diagnosis not present

## 2015-07-20 DIAGNOSIS — E039 Hypothyroidism, unspecified: Secondary | ICD-10-CM | POA: Diagnosis not present

## 2015-07-20 DIAGNOSIS — I2510 Atherosclerotic heart disease of native coronary artery without angina pectoris: Secondary | ICD-10-CM | POA: Diagnosis not present

## 2015-08-04 DIAGNOSIS — M25552 Pain in left hip: Secondary | ICD-10-CM | POA: Diagnosis not present

## 2015-08-04 DIAGNOSIS — E039 Hypothyroidism, unspecified: Secondary | ICD-10-CM | POA: Diagnosis not present

## 2015-08-04 DIAGNOSIS — J309 Allergic rhinitis, unspecified: Secondary | ICD-10-CM | POA: Diagnosis not present

## 2015-08-04 DIAGNOSIS — I2510 Atherosclerotic heart disease of native coronary artery without angina pectoris: Secondary | ICD-10-CM | POA: Diagnosis not present

## 2015-08-04 DIAGNOSIS — I10 Essential (primary) hypertension: Secondary | ICD-10-CM | POA: Diagnosis not present

## 2015-09-01 DIAGNOSIS — E039 Hypothyroidism, unspecified: Secondary | ICD-10-CM | POA: Diagnosis not present

## 2015-09-01 DIAGNOSIS — J309 Allergic rhinitis, unspecified: Secondary | ICD-10-CM | POA: Diagnosis not present

## 2015-09-01 DIAGNOSIS — I10 Essential (primary) hypertension: Secondary | ICD-10-CM | POA: Diagnosis not present

## 2015-09-01 DIAGNOSIS — J209 Acute bronchitis, unspecified: Secondary | ICD-10-CM | POA: Diagnosis not present

## 2015-09-01 DIAGNOSIS — I2510 Atherosclerotic heart disease of native coronary artery without angina pectoris: Secondary | ICD-10-CM | POA: Diagnosis not present

## 2015-09-04 DIAGNOSIS — H43813 Vitreous degeneration, bilateral: Secondary | ICD-10-CM | POA: Diagnosis not present

## 2015-09-04 DIAGNOSIS — H43393 Other vitreous opacities, bilateral: Secondary | ICD-10-CM | POA: Diagnosis not present

## 2015-09-04 DIAGNOSIS — H5203 Hypermetropia, bilateral: Secondary | ICD-10-CM | POA: Diagnosis not present

## 2015-09-04 DIAGNOSIS — Z961 Presence of intraocular lens: Secondary | ICD-10-CM | POA: Diagnosis not present

## 2015-09-28 DIAGNOSIS — I2510 Atherosclerotic heart disease of native coronary artery without angina pectoris: Secondary | ICD-10-CM | POA: Diagnosis not present

## 2015-09-28 DIAGNOSIS — I10 Essential (primary) hypertension: Secondary | ICD-10-CM | POA: Diagnosis not present

## 2015-09-28 DIAGNOSIS — J309 Allergic rhinitis, unspecified: Secondary | ICD-10-CM | POA: Diagnosis not present

## 2015-09-28 DIAGNOSIS — J0100 Acute maxillary sinusitis, unspecified: Secondary | ICD-10-CM | POA: Diagnosis not present

## 2015-09-28 DIAGNOSIS — E039 Hypothyroidism, unspecified: Secondary | ICD-10-CM | POA: Diagnosis not present

## 2015-10-01 DIAGNOSIS — Z79899 Other long term (current) drug therapy: Secondary | ICD-10-CM | POA: Diagnosis not present

## 2015-10-01 DIAGNOSIS — I480 Paroxysmal atrial fibrillation: Secondary | ICD-10-CM | POA: Diagnosis not present

## 2015-10-01 DIAGNOSIS — I119 Hypertensive heart disease without heart failure: Secondary | ICD-10-CM | POA: Diagnosis not present

## 2015-10-01 DIAGNOSIS — R079 Chest pain, unspecified: Secondary | ICD-10-CM | POA: Diagnosis not present

## 2015-10-01 DIAGNOSIS — R000 Tachycardia, unspecified: Secondary | ICD-10-CM | POA: Diagnosis not present

## 2015-10-01 DIAGNOSIS — I10 Essential (primary) hypertension: Secondary | ICD-10-CM | POA: Diagnosis not present

## 2015-10-01 DIAGNOSIS — E784 Other hyperlipidemia: Secondary | ICD-10-CM | POA: Diagnosis not present

## 2015-10-01 DIAGNOSIS — Z951 Presence of aortocoronary bypass graft: Secondary | ICD-10-CM | POA: Diagnosis not present

## 2015-10-01 DIAGNOSIS — E0590 Thyrotoxicosis, unspecified without thyrotoxic crisis or storm: Secondary | ICD-10-CM | POA: Diagnosis not present

## 2015-10-01 DIAGNOSIS — I351 Nonrheumatic aortic (valve) insufficiency: Secondary | ICD-10-CM | POA: Diagnosis not present

## 2015-10-01 DIAGNOSIS — I951 Orthostatic hypotension: Secondary | ICD-10-CM | POA: Diagnosis not present

## 2015-10-01 DIAGNOSIS — I340 Nonrheumatic mitral (valve) insufficiency: Secondary | ICD-10-CM | POA: Diagnosis not present

## 2015-10-01 DIAGNOSIS — R0789 Other chest pain: Secondary | ICD-10-CM | POA: Diagnosis not present

## 2015-10-01 DIAGNOSIS — B349 Viral infection, unspecified: Secondary | ICD-10-CM | POA: Diagnosis not present

## 2015-10-01 DIAGNOSIS — E785 Hyperlipidemia, unspecified: Secondary | ICD-10-CM | POA: Diagnosis not present

## 2015-10-01 DIAGNOSIS — I2510 Atherosclerotic heart disease of native coronary artery without angina pectoris: Secondary | ICD-10-CM | POA: Diagnosis not present

## 2015-10-01 DIAGNOSIS — Z7982 Long term (current) use of aspirin: Secondary | ICD-10-CM | POA: Diagnosis not present

## 2015-10-01 DIAGNOSIS — E039 Hypothyroidism, unspecified: Secondary | ICD-10-CM | POA: Diagnosis not present

## 2015-10-01 DIAGNOSIS — I517 Cardiomegaly: Secondary | ICD-10-CM | POA: Diagnosis not present

## 2015-10-01 DIAGNOSIS — R072 Precordial pain: Secondary | ICD-10-CM | POA: Diagnosis not present

## 2015-10-01 DIAGNOSIS — Z7952 Long term (current) use of systemic steroids: Secondary | ICD-10-CM | POA: Diagnosis not present

## 2015-10-01 DIAGNOSIS — I4891 Unspecified atrial fibrillation: Secondary | ICD-10-CM | POA: Diagnosis not present

## 2015-10-09 DIAGNOSIS — I2510 Atherosclerotic heart disease of native coronary artery without angina pectoris: Secondary | ICD-10-CM | POA: Diagnosis not present

## 2015-10-09 DIAGNOSIS — M25552 Pain in left hip: Secondary | ICD-10-CM | POA: Diagnosis not present

## 2015-10-09 DIAGNOSIS — I10 Essential (primary) hypertension: Secondary | ICD-10-CM | POA: Diagnosis not present

## 2015-10-09 DIAGNOSIS — J309 Allergic rhinitis, unspecified: Secondary | ICD-10-CM | POA: Diagnosis not present

## 2015-10-09 DIAGNOSIS — I4891 Unspecified atrial fibrillation: Secondary | ICD-10-CM | POA: Diagnosis not present

## 2015-10-23 DIAGNOSIS — I2510 Atherosclerotic heart disease of native coronary artery without angina pectoris: Secondary | ICD-10-CM | POA: Diagnosis not present

## 2015-10-23 DIAGNOSIS — E785 Hyperlipidemia, unspecified: Secondary | ICD-10-CM | POA: Diagnosis not present

## 2015-10-23 DIAGNOSIS — I4891 Unspecified atrial fibrillation: Secondary | ICD-10-CM | POA: Diagnosis not present

## 2015-10-23 DIAGNOSIS — D689 Coagulation defect, unspecified: Secondary | ICD-10-CM | POA: Diagnosis not present

## 2015-10-30 DIAGNOSIS — I480 Paroxysmal atrial fibrillation: Secondary | ICD-10-CM | POA: Diagnosis not present

## 2015-11-01 DIAGNOSIS — I4891 Unspecified atrial fibrillation: Secondary | ICD-10-CM | POA: Diagnosis not present

## 2015-11-06 DIAGNOSIS — I480 Paroxysmal atrial fibrillation: Secondary | ICD-10-CM | POA: Diagnosis not present

## 2015-11-06 DIAGNOSIS — Z1389 Encounter for screening for other disorder: Secondary | ICD-10-CM | POA: Diagnosis not present

## 2015-11-06 DIAGNOSIS — Z23 Encounter for immunization: Secondary | ICD-10-CM | POA: Diagnosis not present

## 2015-11-06 DIAGNOSIS — E039 Hypothyroidism, unspecified: Secondary | ICD-10-CM | POA: Diagnosis not present

## 2015-11-06 DIAGNOSIS — I2510 Atherosclerotic heart disease of native coronary artery without angina pectoris: Secondary | ICD-10-CM | POA: Diagnosis not present

## 2015-11-06 DIAGNOSIS — Z79891 Long term (current) use of opiate analgesic: Secondary | ICD-10-CM | POA: Diagnosis not present

## 2015-11-06 DIAGNOSIS — Z7901 Long term (current) use of anticoagulants: Secondary | ICD-10-CM | POA: Diagnosis not present

## 2015-11-06 DIAGNOSIS — M25552 Pain in left hip: Secondary | ICD-10-CM | POA: Diagnosis not present

## 2015-11-06 DIAGNOSIS — M159 Polyosteoarthritis, unspecified: Secondary | ICD-10-CM | POA: Diagnosis not present

## 2015-11-06 DIAGNOSIS — E7800 Pure hypercholesterolemia, unspecified: Secondary | ICD-10-CM | POA: Diagnosis not present

## 2015-11-06 DIAGNOSIS — I10 Essential (primary) hypertension: Secondary | ICD-10-CM | POA: Diagnosis not present

## 2015-12-08 DIAGNOSIS — J449 Chronic obstructive pulmonary disease, unspecified: Secondary | ICD-10-CM | POA: Diagnosis not present

## 2015-12-08 DIAGNOSIS — M25552 Pain in left hip: Secondary | ICD-10-CM | POA: Diagnosis not present

## 2015-12-08 DIAGNOSIS — J309 Allergic rhinitis, unspecified: Secondary | ICD-10-CM | POA: Diagnosis not present

## 2015-12-08 DIAGNOSIS — I10 Essential (primary) hypertension: Secondary | ICD-10-CM | POA: Diagnosis not present

## 2015-12-08 DIAGNOSIS — I2510 Atherosclerotic heart disease of native coronary artery without angina pectoris: Secondary | ICD-10-CM | POA: Diagnosis not present

## 2015-12-15 DIAGNOSIS — I2510 Atherosclerotic heart disease of native coronary artery without angina pectoris: Secondary | ICD-10-CM | POA: Diagnosis not present

## 2015-12-15 DIAGNOSIS — I10 Essential (primary) hypertension: Secondary | ICD-10-CM | POA: Diagnosis not present

## 2015-12-15 DIAGNOSIS — M5416 Radiculopathy, lumbar region: Secondary | ICD-10-CM | POA: Diagnosis not present

## 2015-12-15 DIAGNOSIS — E7800 Pure hypercholesterolemia, unspecified: Secondary | ICD-10-CM | POA: Diagnosis not present

## 2015-12-15 DIAGNOSIS — G4700 Insomnia, unspecified: Secondary | ICD-10-CM | POA: Diagnosis not present

## 2016-01-12 DIAGNOSIS — I2510 Atherosclerotic heart disease of native coronary artery without angina pectoris: Secondary | ICD-10-CM | POA: Diagnosis not present

## 2016-01-12 DIAGNOSIS — Z9181 History of falling: Secondary | ICD-10-CM | POA: Diagnosis not present

## 2016-01-12 DIAGNOSIS — E7800 Pure hypercholesterolemia, unspecified: Secondary | ICD-10-CM | POA: Diagnosis not present

## 2016-01-12 DIAGNOSIS — I10 Essential (primary) hypertension: Secondary | ICD-10-CM | POA: Diagnosis not present

## 2016-01-12 DIAGNOSIS — M5416 Radiculopathy, lumbar region: Secondary | ICD-10-CM | POA: Diagnosis not present

## 2016-01-18 DIAGNOSIS — M5416 Radiculopathy, lumbar region: Secondary | ICD-10-CM | POA: Diagnosis not present

## 2016-01-18 DIAGNOSIS — J209 Acute bronchitis, unspecified: Secondary | ICD-10-CM | POA: Diagnosis not present

## 2016-01-18 DIAGNOSIS — I2510 Atherosclerotic heart disease of native coronary artery without angina pectoris: Secondary | ICD-10-CM | POA: Diagnosis not present

## 2016-01-18 DIAGNOSIS — E7800 Pure hypercholesterolemia, unspecified: Secondary | ICD-10-CM | POA: Diagnosis not present

## 2016-01-18 DIAGNOSIS — I10 Essential (primary) hypertension: Secondary | ICD-10-CM | POA: Diagnosis not present

## 2016-02-13 DIAGNOSIS — M5416 Radiculopathy, lumbar region: Secondary | ICD-10-CM | POA: Diagnosis not present

## 2016-02-13 DIAGNOSIS — G4700 Insomnia, unspecified: Secondary | ICD-10-CM | POA: Diagnosis not present

## 2016-02-13 DIAGNOSIS — E7800 Pure hypercholesterolemia, unspecified: Secondary | ICD-10-CM | POA: Diagnosis not present

## 2016-02-13 DIAGNOSIS — I10 Essential (primary) hypertension: Secondary | ICD-10-CM | POA: Diagnosis not present

## 2016-02-13 DIAGNOSIS — I2510 Atherosclerotic heart disease of native coronary artery without angina pectoris: Secondary | ICD-10-CM | POA: Diagnosis not present

## 2016-02-21 DIAGNOSIS — M5416 Radiculopathy, lumbar region: Secondary | ICD-10-CM | POA: Diagnosis not present

## 2016-02-21 DIAGNOSIS — T148XXA Other injury of unspecified body region, initial encounter: Secondary | ICD-10-CM | POA: Diagnosis not present

## 2016-02-21 DIAGNOSIS — G4700 Insomnia, unspecified: Secondary | ICD-10-CM | POA: Diagnosis not present

## 2016-02-21 DIAGNOSIS — I2510 Atherosclerotic heart disease of native coronary artery without angina pectoris: Secondary | ICD-10-CM | POA: Diagnosis not present

## 2016-02-21 DIAGNOSIS — E7800 Pure hypercholesterolemia, unspecified: Secondary | ICD-10-CM | POA: Diagnosis not present

## 2016-04-12 DIAGNOSIS — I2510 Atherosclerotic heart disease of native coronary artery without angina pectoris: Secondary | ICD-10-CM | POA: Diagnosis not present

## 2016-04-12 DIAGNOSIS — M159 Polyosteoarthritis, unspecified: Secondary | ICD-10-CM | POA: Diagnosis not present

## 2016-04-12 DIAGNOSIS — G4700 Insomnia, unspecified: Secondary | ICD-10-CM | POA: Diagnosis not present

## 2016-04-12 DIAGNOSIS — M5416 Radiculopathy, lumbar region: Secondary | ICD-10-CM | POA: Diagnosis not present

## 2016-04-12 DIAGNOSIS — E7800 Pure hypercholesterolemia, unspecified: Secondary | ICD-10-CM | POA: Diagnosis not present

## 2016-04-12 DIAGNOSIS — I10 Essential (primary) hypertension: Secondary | ICD-10-CM | POA: Diagnosis not present

## 2016-06-05 DIAGNOSIS — I2510 Atherosclerotic heart disease of native coronary artery without angina pectoris: Secondary | ICD-10-CM | POA: Diagnosis not present

## 2016-06-05 DIAGNOSIS — E7800 Pure hypercholesterolemia, unspecified: Secondary | ICD-10-CM | POA: Diagnosis not present

## 2016-06-05 DIAGNOSIS — J209 Acute bronchitis, unspecified: Secondary | ICD-10-CM | POA: Diagnosis not present

## 2016-06-05 DIAGNOSIS — G4700 Insomnia, unspecified: Secondary | ICD-10-CM | POA: Diagnosis not present

## 2016-06-05 DIAGNOSIS — M5416 Radiculopathy, lumbar region: Secondary | ICD-10-CM | POA: Diagnosis not present

## 2016-06-12 DIAGNOSIS — E7800 Pure hypercholesterolemia, unspecified: Secondary | ICD-10-CM | POA: Diagnosis not present

## 2016-06-12 DIAGNOSIS — J209 Acute bronchitis, unspecified: Secondary | ICD-10-CM | POA: Diagnosis not present

## 2016-06-12 DIAGNOSIS — I2510 Atherosclerotic heart disease of native coronary artery without angina pectoris: Secondary | ICD-10-CM | POA: Diagnosis not present

## 2016-06-12 DIAGNOSIS — G4700 Insomnia, unspecified: Secondary | ICD-10-CM | POA: Diagnosis not present

## 2016-06-12 DIAGNOSIS — M5416 Radiculopathy, lumbar region: Secondary | ICD-10-CM | POA: Diagnosis not present

## 2016-06-21 DIAGNOSIS — Z139 Encounter for screening, unspecified: Secondary | ICD-10-CM | POA: Diagnosis not present

## 2016-06-21 DIAGNOSIS — E7800 Pure hypercholesterolemia, unspecified: Secondary | ICD-10-CM | POA: Diagnosis not present

## 2016-06-21 DIAGNOSIS — G4700 Insomnia, unspecified: Secondary | ICD-10-CM | POA: Diagnosis not present

## 2016-06-21 DIAGNOSIS — M5416 Radiculopathy, lumbar region: Secondary | ICD-10-CM | POA: Diagnosis not present

## 2016-06-21 DIAGNOSIS — I2510 Atherosclerotic heart disease of native coronary artery without angina pectoris: Secondary | ICD-10-CM | POA: Diagnosis not present

## 2016-07-12 DIAGNOSIS — G4700 Insomnia, unspecified: Secondary | ICD-10-CM | POA: Diagnosis not present

## 2016-07-12 DIAGNOSIS — M159 Polyosteoarthritis, unspecified: Secondary | ICD-10-CM | POA: Diagnosis not present

## 2016-07-12 DIAGNOSIS — E7800 Pure hypercholesterolemia, unspecified: Secondary | ICD-10-CM | POA: Diagnosis not present

## 2016-07-12 DIAGNOSIS — E039 Hypothyroidism, unspecified: Secondary | ICD-10-CM | POA: Diagnosis not present

## 2016-07-12 DIAGNOSIS — M5416 Radiculopathy, lumbar region: Secondary | ICD-10-CM | POA: Diagnosis not present

## 2016-07-12 DIAGNOSIS — I2510 Atherosclerotic heart disease of native coronary artery without angina pectoris: Secondary | ICD-10-CM | POA: Diagnosis not present

## 2016-07-12 DIAGNOSIS — I10 Essential (primary) hypertension: Secondary | ICD-10-CM | POA: Diagnosis not present

## 2016-08-12 DIAGNOSIS — I10 Essential (primary) hypertension: Secondary | ICD-10-CM | POA: Diagnosis not present

## 2016-08-12 DIAGNOSIS — I480 Paroxysmal atrial fibrillation: Secondary | ICD-10-CM | POA: Diagnosis not present

## 2016-08-12 DIAGNOSIS — T148XXA Other injury of unspecified body region, initial encounter: Secondary | ICD-10-CM | POA: Diagnosis not present

## 2016-08-12 DIAGNOSIS — Z7901 Long term (current) use of anticoagulants: Secondary | ICD-10-CM | POA: Diagnosis not present

## 2016-08-12 DIAGNOSIS — I2510 Atherosclerotic heart disease of native coronary artery without angina pectoris: Secondary | ICD-10-CM | POA: Diagnosis not present

## 2016-09-05 DIAGNOSIS — H52203 Unspecified astigmatism, bilateral: Secondary | ICD-10-CM | POA: Diagnosis not present

## 2016-09-05 DIAGNOSIS — H43813 Vitreous degeneration, bilateral: Secondary | ICD-10-CM | POA: Diagnosis not present

## 2016-09-05 DIAGNOSIS — H43393 Other vitreous opacities, bilateral: Secondary | ICD-10-CM | POA: Diagnosis not present

## 2016-09-05 DIAGNOSIS — H5203 Hypermetropia, bilateral: Secondary | ICD-10-CM | POA: Diagnosis not present

## 2016-09-05 DIAGNOSIS — H524 Presbyopia: Secondary | ICD-10-CM | POA: Diagnosis not present

## 2016-10-10 DIAGNOSIS — H401213 Low-tension glaucoma, right eye, severe stage: Secondary | ICD-10-CM | POA: Diagnosis not present

## 2016-10-10 DIAGNOSIS — H47021 Hemorrhage in optic nerve sheath, right eye: Secondary | ICD-10-CM | POA: Diagnosis not present

## 2016-10-10 DIAGNOSIS — H401222 Low-tension glaucoma, left eye, moderate stage: Secondary | ICD-10-CM | POA: Diagnosis not present

## 2016-10-10 DIAGNOSIS — H5111 Convergence insufficiency: Secondary | ICD-10-CM | POA: Diagnosis not present

## 2016-10-10 DIAGNOSIS — H348312 Tributary (branch) retinal vein occlusion, right eye, stable: Secondary | ICD-10-CM | POA: Diagnosis not present

## 2016-10-15 DIAGNOSIS — E7800 Pure hypercholesterolemia, unspecified: Secondary | ICD-10-CM | POA: Diagnosis not present

## 2016-10-15 DIAGNOSIS — M25552 Pain in left hip: Secondary | ICD-10-CM | POA: Diagnosis not present

## 2016-10-15 DIAGNOSIS — I2510 Atherosclerotic heart disease of native coronary artery without angina pectoris: Secondary | ICD-10-CM | POA: Diagnosis not present

## 2016-10-15 DIAGNOSIS — I4891 Unspecified atrial fibrillation: Secondary | ICD-10-CM | POA: Diagnosis not present

## 2016-10-15 DIAGNOSIS — I10 Essential (primary) hypertension: Secondary | ICD-10-CM | POA: Diagnosis not present

## 2016-10-15 DIAGNOSIS — J449 Chronic obstructive pulmonary disease, unspecified: Secondary | ICD-10-CM | POA: Diagnosis not present

## 2016-10-24 DIAGNOSIS — H401222 Low-tension glaucoma, left eye, moderate stage: Secondary | ICD-10-CM | POA: Diagnosis not present

## 2016-10-24 DIAGNOSIS — H401213 Low-tension glaucoma, right eye, severe stage: Secondary | ICD-10-CM | POA: Diagnosis not present

## 2016-11-27 DIAGNOSIS — H6123 Impacted cerumen, bilateral: Secondary | ICD-10-CM | POA: Diagnosis not present

## 2016-12-06 DIAGNOSIS — Z23 Encounter for immunization: Secondary | ICD-10-CM | POA: Diagnosis not present

## 2016-12-13 DIAGNOSIS — R5382 Chronic fatigue, unspecified: Secondary | ICD-10-CM | POA: Diagnosis not present

## 2017-01-14 DIAGNOSIS — E039 Hypothyroidism, unspecified: Secondary | ICD-10-CM | POA: Diagnosis not present

## 2017-01-14 DIAGNOSIS — I10 Essential (primary) hypertension: Secondary | ICD-10-CM | POA: Diagnosis not present

## 2017-01-14 DIAGNOSIS — J449 Chronic obstructive pulmonary disease, unspecified: Secondary | ICD-10-CM | POA: Diagnosis not present

## 2017-01-14 DIAGNOSIS — I2510 Atherosclerotic heart disease of native coronary artery without angina pectoris: Secondary | ICD-10-CM | POA: Diagnosis not present

## 2017-01-14 DIAGNOSIS — E7800 Pure hypercholesterolemia, unspecified: Secondary | ICD-10-CM | POA: Diagnosis not present

## 2017-01-14 DIAGNOSIS — Z9181 History of falling: Secondary | ICD-10-CM | POA: Diagnosis not present

## 2017-01-14 DIAGNOSIS — Z1331 Encounter for screening for depression: Secondary | ICD-10-CM | POA: Diagnosis not present

## 2017-01-14 DIAGNOSIS — I4891 Unspecified atrial fibrillation: Secondary | ICD-10-CM | POA: Diagnosis not present

## 2017-01-14 DIAGNOSIS — D649 Anemia, unspecified: Secondary | ICD-10-CM | POA: Diagnosis not present

## 2017-02-07 DIAGNOSIS — M5416 Radiculopathy, lumbar region: Secondary | ICD-10-CM | POA: Diagnosis not present

## 2017-02-07 DIAGNOSIS — J449 Chronic obstructive pulmonary disease, unspecified: Secondary | ICD-10-CM | POA: Diagnosis not present

## 2017-02-07 DIAGNOSIS — I2510 Atherosclerotic heart disease of native coronary artery without angina pectoris: Secondary | ICD-10-CM | POA: Diagnosis not present

## 2017-02-07 DIAGNOSIS — R197 Diarrhea, unspecified: Secondary | ICD-10-CM | POA: Diagnosis not present

## 2017-02-07 DIAGNOSIS — I4891 Unspecified atrial fibrillation: Secondary | ICD-10-CM | POA: Diagnosis not present

## 2017-03-06 DIAGNOSIS — H401213 Low-tension glaucoma, right eye, severe stage: Secondary | ICD-10-CM | POA: Diagnosis not present

## 2017-03-06 DIAGNOSIS — H401222 Low-tension glaucoma, left eye, moderate stage: Secondary | ICD-10-CM | POA: Diagnosis not present

## 2017-03-10 DIAGNOSIS — I480 Paroxysmal atrial fibrillation: Secondary | ICD-10-CM | POA: Diagnosis not present

## 2017-03-10 DIAGNOSIS — Z7901 Long term (current) use of anticoagulants: Secondary | ICD-10-CM | POA: Diagnosis not present

## 2017-03-10 DIAGNOSIS — I2510 Atherosclerotic heart disease of native coronary artery without angina pectoris: Secondary | ICD-10-CM | POA: Diagnosis not present

## 2017-04-14 DIAGNOSIS — E46 Unspecified protein-calorie malnutrition: Secondary | ICD-10-CM | POA: Diagnosis not present

## 2017-04-14 DIAGNOSIS — D649 Anemia, unspecified: Secondary | ICD-10-CM | POA: Diagnosis not present

## 2017-04-14 DIAGNOSIS — I4891 Unspecified atrial fibrillation: Secondary | ICD-10-CM | POA: Diagnosis not present

## 2017-04-14 DIAGNOSIS — E7800 Pure hypercholesterolemia, unspecified: Secondary | ICD-10-CM | POA: Diagnosis not present

## 2017-04-14 DIAGNOSIS — I2510 Atherosclerotic heart disease of native coronary artery without angina pectoris: Secondary | ICD-10-CM | POA: Diagnosis not present

## 2017-04-14 DIAGNOSIS — J449 Chronic obstructive pulmonary disease, unspecified: Secondary | ICD-10-CM | POA: Diagnosis not present

## 2017-04-14 DIAGNOSIS — I10 Essential (primary) hypertension: Secondary | ICD-10-CM | POA: Diagnosis not present

## 2017-04-14 DIAGNOSIS — R5382 Chronic fatigue, unspecified: Secondary | ICD-10-CM | POA: Diagnosis not present

## 2017-07-08 DIAGNOSIS — H401213 Low-tension glaucoma, right eye, severe stage: Secondary | ICD-10-CM | POA: Diagnosis not present

## 2017-07-08 DIAGNOSIS — H401222 Low-tension glaucoma, left eye, moderate stage: Secondary | ICD-10-CM | POA: Diagnosis not present

## 2017-07-08 DIAGNOSIS — H43393 Other vitreous opacities, bilateral: Secondary | ICD-10-CM | POA: Diagnosis not present

## 2017-07-08 DIAGNOSIS — H18413 Arcus senilis, bilateral: Secondary | ICD-10-CM | POA: Diagnosis not present

## 2017-07-08 DIAGNOSIS — H43813 Vitreous degeneration, bilateral: Secondary | ICD-10-CM | POA: Diagnosis not present

## 2017-07-15 DIAGNOSIS — E7800 Pure hypercholesterolemia, unspecified: Secondary | ICD-10-CM | POA: Diagnosis not present

## 2017-07-15 DIAGNOSIS — D649 Anemia, unspecified: Secondary | ICD-10-CM | POA: Diagnosis not present

## 2017-07-15 DIAGNOSIS — E039 Hypothyroidism, unspecified: Secondary | ICD-10-CM | POA: Diagnosis not present

## 2017-07-15 DIAGNOSIS — I10 Essential (primary) hypertension: Secondary | ICD-10-CM | POA: Diagnosis not present

## 2017-07-15 DIAGNOSIS — E46 Unspecified protein-calorie malnutrition: Secondary | ICD-10-CM | POA: Diagnosis not present

## 2017-07-15 DIAGNOSIS — J449 Chronic obstructive pulmonary disease, unspecified: Secondary | ICD-10-CM | POA: Diagnosis not present

## 2017-07-15 DIAGNOSIS — I2510 Atherosclerotic heart disease of native coronary artery without angina pectoris: Secondary | ICD-10-CM | POA: Diagnosis not present

## 2017-07-15 DIAGNOSIS — M25552 Pain in left hip: Secondary | ICD-10-CM | POA: Diagnosis not present

## 2017-07-15 DIAGNOSIS — I4891 Unspecified atrial fibrillation: Secondary | ICD-10-CM | POA: Diagnosis not present

## 2017-09-23 DIAGNOSIS — J208 Acute bronchitis due to other specified organisms: Secondary | ICD-10-CM | POA: Diagnosis not present

## 2017-09-23 DIAGNOSIS — E46 Unspecified protein-calorie malnutrition: Secondary | ICD-10-CM | POA: Diagnosis not present

## 2017-09-23 DIAGNOSIS — I2510 Atherosclerotic heart disease of native coronary artery without angina pectoris: Secondary | ICD-10-CM | POA: Diagnosis not present

## 2017-09-23 DIAGNOSIS — I4891 Unspecified atrial fibrillation: Secondary | ICD-10-CM | POA: Diagnosis not present

## 2017-09-23 DIAGNOSIS — M159 Polyosteoarthritis, unspecified: Secondary | ICD-10-CM | POA: Diagnosis not present

## 2017-09-30 DIAGNOSIS — Z139 Encounter for screening, unspecified: Secondary | ICD-10-CM | POA: Diagnosis not present

## 2017-09-30 DIAGNOSIS — I4891 Unspecified atrial fibrillation: Secondary | ICD-10-CM | POA: Diagnosis not present

## 2017-09-30 DIAGNOSIS — Z0000 Encounter for general adult medical examination without abnormal findings: Secondary | ICD-10-CM | POA: Diagnosis not present

## 2017-09-30 DIAGNOSIS — J208 Acute bronchitis due to other specified organisms: Secondary | ICD-10-CM | POA: Diagnosis not present

## 2017-09-30 DIAGNOSIS — E46 Unspecified protein-calorie malnutrition: Secondary | ICD-10-CM | POA: Diagnosis not present

## 2017-09-30 DIAGNOSIS — I2510 Atherosclerotic heart disease of native coronary artery without angina pectoris: Secondary | ICD-10-CM | POA: Diagnosis not present

## 2017-09-30 DIAGNOSIS — E785 Hyperlipidemia, unspecified: Secondary | ICD-10-CM | POA: Diagnosis not present

## 2017-09-30 DIAGNOSIS — Z9181 History of falling: Secondary | ICD-10-CM | POA: Diagnosis not present

## 2017-10-06 DIAGNOSIS — I2510 Atherosclerotic heart disease of native coronary artery without angina pectoris: Secondary | ICD-10-CM | POA: Diagnosis not present

## 2017-10-06 DIAGNOSIS — R002 Palpitations: Secondary | ICD-10-CM | POA: Diagnosis not present

## 2017-10-06 DIAGNOSIS — I480 Paroxysmal atrial fibrillation: Secondary | ICD-10-CM | POA: Diagnosis not present

## 2017-10-06 DIAGNOSIS — R0789 Other chest pain: Secondary | ICD-10-CM | POA: Diagnosis not present

## 2017-10-06 DIAGNOSIS — Z7901 Long term (current) use of anticoagulants: Secondary | ICD-10-CM | POA: Diagnosis not present

## 2017-10-07 DIAGNOSIS — R002 Palpitations: Secondary | ICD-10-CM | POA: Diagnosis not present

## 2017-10-07 DIAGNOSIS — I480 Paroxysmal atrial fibrillation: Secondary | ICD-10-CM | POA: Diagnosis not present

## 2017-10-15 DIAGNOSIS — I10 Essential (primary) hypertension: Secondary | ICD-10-CM | POA: Diagnosis not present

## 2017-10-15 DIAGNOSIS — I2510 Atherosclerotic heart disease of native coronary artery without angina pectoris: Secondary | ICD-10-CM | POA: Diagnosis not present

## 2017-10-15 DIAGNOSIS — D649 Anemia, unspecified: Secondary | ICD-10-CM | POA: Diagnosis not present

## 2017-10-15 DIAGNOSIS — I4891 Unspecified atrial fibrillation: Secondary | ICD-10-CM | POA: Diagnosis not present

## 2017-10-15 DIAGNOSIS — M159 Polyosteoarthritis, unspecified: Secondary | ICD-10-CM | POA: Diagnosis not present

## 2017-10-15 DIAGNOSIS — E7800 Pure hypercholesterolemia, unspecified: Secondary | ICD-10-CM | POA: Diagnosis not present

## 2017-10-15 DIAGNOSIS — E46 Unspecified protein-calorie malnutrition: Secondary | ICD-10-CM | POA: Diagnosis not present

## 2017-10-21 DIAGNOSIS — I2510 Atherosclerotic heart disease of native coronary artery without angina pectoris: Secondary | ICD-10-CM | POA: Diagnosis not present

## 2017-10-21 DIAGNOSIS — R0789 Other chest pain: Secondary | ICD-10-CM | POA: Diagnosis not present

## 2017-10-27 DIAGNOSIS — I480 Paroxysmal atrial fibrillation: Secondary | ICD-10-CM | POA: Diagnosis not present

## 2017-10-27 DIAGNOSIS — R002 Palpitations: Secondary | ICD-10-CM | POA: Diagnosis not present

## 2017-10-28 DIAGNOSIS — M159 Polyosteoarthritis, unspecified: Secondary | ICD-10-CM | POA: Diagnosis not present

## 2017-10-28 DIAGNOSIS — I2510 Atherosclerotic heart disease of native coronary artery without angina pectoris: Secondary | ICD-10-CM | POA: Diagnosis not present

## 2017-10-28 DIAGNOSIS — E46 Unspecified protein-calorie malnutrition: Secondary | ICD-10-CM | POA: Diagnosis not present

## 2017-10-28 DIAGNOSIS — J208 Acute bronchitis due to other specified organisms: Secondary | ICD-10-CM | POA: Diagnosis not present

## 2017-10-28 DIAGNOSIS — I4891 Unspecified atrial fibrillation: Secondary | ICD-10-CM | POA: Diagnosis not present

## 2017-11-13 DIAGNOSIS — H401233 Low-tension glaucoma, bilateral, severe stage: Secondary | ICD-10-CM | POA: Diagnosis not present

## 2017-11-20 DIAGNOSIS — Z23 Encounter for immunization: Secondary | ICD-10-CM | POA: Diagnosis not present

## 2017-11-25 DIAGNOSIS — I2510 Atherosclerotic heart disease of native coronary artery without angina pectoris: Secondary | ICD-10-CM | POA: Diagnosis not present

## 2017-11-25 DIAGNOSIS — R0789 Other chest pain: Secondary | ICD-10-CM | POA: Diagnosis not present

## 2017-11-25 DIAGNOSIS — Z7901 Long term (current) use of anticoagulants: Secondary | ICD-10-CM | POA: Diagnosis not present

## 2017-11-25 DIAGNOSIS — I480 Paroxysmal atrial fibrillation: Secondary | ICD-10-CM | POA: Diagnosis not present

## 2017-12-04 DIAGNOSIS — H401233 Low-tension glaucoma, bilateral, severe stage: Secondary | ICD-10-CM | POA: Diagnosis not present

## 2017-12-25 DIAGNOSIS — H401233 Low-tension glaucoma, bilateral, severe stage: Secondary | ICD-10-CM | POA: Diagnosis not present

## 2018-01-16 DIAGNOSIS — I10 Essential (primary) hypertension: Secondary | ICD-10-CM | POA: Diagnosis not present

## 2018-01-16 DIAGNOSIS — E039 Hypothyroidism, unspecified: Secondary | ICD-10-CM | POA: Diagnosis not present

## 2018-01-16 DIAGNOSIS — I4891 Unspecified atrial fibrillation: Secondary | ICD-10-CM | POA: Diagnosis not present

## 2018-01-16 DIAGNOSIS — E46 Unspecified protein-calorie malnutrition: Secondary | ICD-10-CM | POA: Diagnosis not present

## 2018-01-16 DIAGNOSIS — I2510 Atherosclerotic heart disease of native coronary artery without angina pectoris: Secondary | ICD-10-CM | POA: Diagnosis not present

## 2018-01-16 DIAGNOSIS — E7800 Pure hypercholesterolemia, unspecified: Secondary | ICD-10-CM | POA: Diagnosis not present

## 2018-01-16 DIAGNOSIS — M159 Polyosteoarthritis, unspecified: Secondary | ICD-10-CM | POA: Diagnosis not present

## 2018-01-16 DIAGNOSIS — D649 Anemia, unspecified: Secondary | ICD-10-CM | POA: Diagnosis not present

## 2018-01-23 DIAGNOSIS — D649 Anemia, unspecified: Secondary | ICD-10-CM | POA: Diagnosis not present

## 2018-01-23 DIAGNOSIS — M25552 Pain in left hip: Secondary | ICD-10-CM | POA: Diagnosis not present

## 2018-01-23 DIAGNOSIS — E039 Hypothyroidism, unspecified: Secondary | ICD-10-CM | POA: Diagnosis not present

## 2018-01-23 DIAGNOSIS — I4891 Unspecified atrial fibrillation: Secondary | ICD-10-CM | POA: Diagnosis not present

## 2018-01-23 DIAGNOSIS — I2510 Atherosclerotic heart disease of native coronary artery without angina pectoris: Secondary | ICD-10-CM | POA: Diagnosis not present

## 2018-02-13 DIAGNOSIS — I4891 Unspecified atrial fibrillation: Secondary | ICD-10-CM | POA: Diagnosis not present

## 2018-02-13 DIAGNOSIS — J208 Acute bronchitis due to other specified organisms: Secondary | ICD-10-CM | POA: Diagnosis not present

## 2018-02-13 DIAGNOSIS — I2510 Atherosclerotic heart disease of native coronary artery without angina pectoris: Secondary | ICD-10-CM | POA: Diagnosis not present

## 2018-02-13 DIAGNOSIS — M159 Polyosteoarthritis, unspecified: Secondary | ICD-10-CM | POA: Diagnosis not present

## 2018-02-13 DIAGNOSIS — E46 Unspecified protein-calorie malnutrition: Secondary | ICD-10-CM | POA: Diagnosis not present

## 2018-02-23 DIAGNOSIS — I2510 Atherosclerotic heart disease of native coronary artery without angina pectoris: Secondary | ICD-10-CM | POA: Diagnosis not present

## 2018-02-23 DIAGNOSIS — I480 Paroxysmal atrial fibrillation: Secondary | ICD-10-CM | POA: Diagnosis not present

## 2018-02-23 DIAGNOSIS — Z7901 Long term (current) use of anticoagulants: Secondary | ICD-10-CM | POA: Diagnosis not present

## 2018-02-23 DIAGNOSIS — R42 Dizziness and giddiness: Secondary | ICD-10-CM | POA: Diagnosis not present

## 2018-03-13 DIAGNOSIS — I2510 Atherosclerotic heart disease of native coronary artery without angina pectoris: Secondary | ICD-10-CM | POA: Diagnosis not present

## 2018-03-13 DIAGNOSIS — M159 Polyosteoarthritis, unspecified: Secondary | ICD-10-CM | POA: Diagnosis not present

## 2018-03-13 DIAGNOSIS — I4891 Unspecified atrial fibrillation: Secondary | ICD-10-CM | POA: Diagnosis not present

## 2018-03-13 DIAGNOSIS — J208 Acute bronchitis due to other specified organisms: Secondary | ICD-10-CM | POA: Diagnosis not present

## 2018-03-20 DIAGNOSIS — S81019A Laceration without foreign body, unspecified knee, initial encounter: Secondary | ICD-10-CM | POA: Diagnosis not present

## 2018-03-20 DIAGNOSIS — M159 Polyosteoarthritis, unspecified: Secondary | ICD-10-CM | POA: Diagnosis not present

## 2018-03-20 DIAGNOSIS — I4891 Unspecified atrial fibrillation: Secondary | ICD-10-CM | POA: Diagnosis not present

## 2018-03-20 DIAGNOSIS — I2510 Atherosclerotic heart disease of native coronary artery without angina pectoris: Secondary | ICD-10-CM | POA: Diagnosis not present

## 2018-03-27 DIAGNOSIS — I959 Hypotension, unspecified: Secondary | ICD-10-CM | POA: Diagnosis not present

## 2018-03-27 DIAGNOSIS — W19XXXA Unspecified fall, initial encounter: Secondary | ICD-10-CM | POA: Diagnosis not present

## 2018-03-27 DIAGNOSIS — T446X5A Adverse effect of alpha-adrenoreceptor antagonists, initial encounter: Secondary | ICD-10-CM | POA: Diagnosis not present

## 2018-03-27 DIAGNOSIS — R55 Syncope and collapse: Secondary | ICD-10-CM | POA: Diagnosis not present

## 2018-03-27 DIAGNOSIS — R4020 Unspecified coma: Secondary | ICD-10-CM | POA: Diagnosis not present

## 2018-03-27 DIAGNOSIS — T50905A Adverse effect of unspecified drugs, medicaments and biological substances, initial encounter: Secondary | ICD-10-CM | POA: Diagnosis not present

## 2018-03-27 DIAGNOSIS — S0990XA Unspecified injury of head, initial encounter: Secondary | ICD-10-CM | POA: Diagnosis not present

## 2018-03-27 DIAGNOSIS — R296 Repeated falls: Secondary | ICD-10-CM | POA: Diagnosis not present

## 2018-03-27 DIAGNOSIS — S299XXA Unspecified injury of thorax, initial encounter: Secondary | ICD-10-CM | POA: Diagnosis not present

## 2018-03-27 DIAGNOSIS — I952 Hypotension due to drugs: Secondary | ICD-10-CM | POA: Diagnosis not present

## 2018-03-27 DIAGNOSIS — S0081XA Abrasion of other part of head, initial encounter: Secondary | ICD-10-CM | POA: Diagnosis not present

## 2018-04-06 DIAGNOSIS — I4891 Unspecified atrial fibrillation: Secondary | ICD-10-CM | POA: Diagnosis not present

## 2018-04-06 DIAGNOSIS — L0390 Cellulitis, unspecified: Secondary | ICD-10-CM | POA: Diagnosis not present

## 2018-04-06 DIAGNOSIS — M159 Polyosteoarthritis, unspecified: Secondary | ICD-10-CM | POA: Diagnosis not present

## 2018-04-06 DIAGNOSIS — E46 Unspecified protein-calorie malnutrition: Secondary | ICD-10-CM | POA: Diagnosis not present

## 2018-04-06 DIAGNOSIS — I2510 Atherosclerotic heart disease of native coronary artery without angina pectoris: Secondary | ICD-10-CM | POA: Diagnosis not present

## 2018-04-14 DIAGNOSIS — R42 Dizziness and giddiness: Secondary | ICD-10-CM | POA: Diagnosis not present

## 2018-04-14 DIAGNOSIS — I4891 Unspecified atrial fibrillation: Secondary | ICD-10-CM | POA: Diagnosis not present

## 2018-04-14 DIAGNOSIS — I2510 Atherosclerotic heart disease of native coronary artery without angina pectoris: Secondary | ICD-10-CM | POA: Diagnosis not present

## 2018-04-14 DIAGNOSIS — E46 Unspecified protein-calorie malnutrition: Secondary | ICD-10-CM | POA: Diagnosis not present

## 2018-04-14 DIAGNOSIS — M159 Polyosteoarthritis, unspecified: Secondary | ICD-10-CM | POA: Diagnosis not present

## 2018-04-17 DIAGNOSIS — M25552 Pain in left hip: Secondary | ICD-10-CM | POA: Diagnosis not present

## 2018-04-17 DIAGNOSIS — I10 Essential (primary) hypertension: Secondary | ICD-10-CM | POA: Diagnosis not present

## 2018-04-17 DIAGNOSIS — I4891 Unspecified atrial fibrillation: Secondary | ICD-10-CM | POA: Diagnosis not present

## 2018-04-17 DIAGNOSIS — E46 Unspecified protein-calorie malnutrition: Secondary | ICD-10-CM | POA: Diagnosis not present

## 2018-04-17 DIAGNOSIS — D649 Anemia, unspecified: Secondary | ICD-10-CM | POA: Diagnosis not present

## 2018-04-17 DIAGNOSIS — M159 Polyosteoarthritis, unspecified: Secondary | ICD-10-CM | POA: Diagnosis not present

## 2018-04-17 DIAGNOSIS — E7800 Pure hypercholesterolemia, unspecified: Secondary | ICD-10-CM | POA: Diagnosis not present

## 2018-04-17 DIAGNOSIS — I2510 Atherosclerotic heart disease of native coronary artery without angina pectoris: Secondary | ICD-10-CM | POA: Diagnosis not present

## 2018-05-06 DIAGNOSIS — I2510 Atherosclerotic heart disease of native coronary artery without angina pectoris: Secondary | ICD-10-CM | POA: Diagnosis not present

## 2018-05-06 DIAGNOSIS — I4891 Unspecified atrial fibrillation: Secondary | ICD-10-CM | POA: Diagnosis not present

## 2018-05-06 DIAGNOSIS — E46 Unspecified protein-calorie malnutrition: Secondary | ICD-10-CM | POA: Diagnosis not present

## 2018-05-06 DIAGNOSIS — J069 Acute upper respiratory infection, unspecified: Secondary | ICD-10-CM | POA: Diagnosis not present

## 2018-05-06 DIAGNOSIS — M159 Polyosteoarthritis, unspecified: Secondary | ICD-10-CM | POA: Diagnosis not present

## 2018-05-13 DIAGNOSIS — M159 Polyosteoarthritis, unspecified: Secondary | ICD-10-CM | POA: Diagnosis not present

## 2018-05-13 DIAGNOSIS — J069 Acute upper respiratory infection, unspecified: Secondary | ICD-10-CM | POA: Diagnosis not present

## 2018-05-13 DIAGNOSIS — E46 Unspecified protein-calorie malnutrition: Secondary | ICD-10-CM | POA: Diagnosis not present

## 2018-05-13 DIAGNOSIS — I4891 Unspecified atrial fibrillation: Secondary | ICD-10-CM | POA: Diagnosis not present

## 2018-05-13 DIAGNOSIS — I2510 Atherosclerotic heart disease of native coronary artery without angina pectoris: Secondary | ICD-10-CM | POA: Diagnosis not present

## 2018-05-20 DIAGNOSIS — Z6820 Body mass index (BMI) 20.0-20.9, adult: Secondary | ICD-10-CM | POA: Diagnosis not present

## 2018-05-20 DIAGNOSIS — J208 Acute bronchitis due to other specified organisms: Secondary | ICD-10-CM | POA: Diagnosis not present

## 2018-05-20 DIAGNOSIS — I2510 Atherosclerotic heart disease of native coronary artery without angina pectoris: Secondary | ICD-10-CM | POA: Diagnosis not present

## 2018-05-20 DIAGNOSIS — I4891 Unspecified atrial fibrillation: Secondary | ICD-10-CM | POA: Diagnosis not present

## 2018-05-20 DIAGNOSIS — M159 Polyosteoarthritis, unspecified: Secondary | ICD-10-CM | POA: Diagnosis not present

## 2018-06-03 DIAGNOSIS — E46 Unspecified protein-calorie malnutrition: Secondary | ICD-10-CM | POA: Diagnosis not present

## 2018-06-03 DIAGNOSIS — M159 Polyosteoarthritis, unspecified: Secondary | ICD-10-CM | POA: Diagnosis not present

## 2018-06-03 DIAGNOSIS — I2510 Atherosclerotic heart disease of native coronary artery without angina pectoris: Secondary | ICD-10-CM | POA: Diagnosis not present

## 2018-06-03 DIAGNOSIS — I4891 Unspecified atrial fibrillation: Secondary | ICD-10-CM | POA: Diagnosis not present

## 2018-07-01 DIAGNOSIS — M159 Polyosteoarthritis, unspecified: Secondary | ICD-10-CM | POA: Diagnosis not present

## 2018-07-01 DIAGNOSIS — I2510 Atherosclerotic heart disease of native coronary artery without angina pectoris: Secondary | ICD-10-CM | POA: Diagnosis not present

## 2018-07-01 DIAGNOSIS — E46 Unspecified protein-calorie malnutrition: Secondary | ICD-10-CM | POA: Diagnosis not present

## 2018-07-01 DIAGNOSIS — I4891 Unspecified atrial fibrillation: Secondary | ICD-10-CM | POA: Diagnosis not present

## 2018-07-20 DIAGNOSIS — I4891 Unspecified atrial fibrillation: Secondary | ICD-10-CM | POA: Diagnosis not present

## 2018-07-20 DIAGNOSIS — M25552 Pain in left hip: Secondary | ICD-10-CM | POA: Diagnosis not present

## 2018-07-20 DIAGNOSIS — I10 Essential (primary) hypertension: Secondary | ICD-10-CM | POA: Diagnosis not present

## 2018-07-20 DIAGNOSIS — E7800 Pure hypercholesterolemia, unspecified: Secondary | ICD-10-CM | POA: Diagnosis not present

## 2018-07-20 DIAGNOSIS — M159 Polyosteoarthritis, unspecified: Secondary | ICD-10-CM | POA: Diagnosis not present

## 2018-07-20 DIAGNOSIS — D649 Anemia, unspecified: Secondary | ICD-10-CM | POA: Diagnosis not present

## 2018-07-20 DIAGNOSIS — J069 Acute upper respiratory infection, unspecified: Secondary | ICD-10-CM | POA: Diagnosis not present

## 2018-07-20 DIAGNOSIS — I2510 Atherosclerotic heart disease of native coronary artery without angina pectoris: Secondary | ICD-10-CM | POA: Diagnosis not present

## 2018-07-20 DIAGNOSIS — E039 Hypothyroidism, unspecified: Secondary | ICD-10-CM | POA: Diagnosis not present

## 2018-07-28 DIAGNOSIS — E46 Unspecified protein-calorie malnutrition: Secondary | ICD-10-CM | POA: Diagnosis not present

## 2018-07-28 DIAGNOSIS — I2510 Atherosclerotic heart disease of native coronary artery without angina pectoris: Secondary | ICD-10-CM | POA: Diagnosis not present

## 2018-07-28 DIAGNOSIS — M159 Polyosteoarthritis, unspecified: Secondary | ICD-10-CM | POA: Diagnosis not present

## 2018-07-28 DIAGNOSIS — I4891 Unspecified atrial fibrillation: Secondary | ICD-10-CM | POA: Diagnosis not present

## 2018-07-28 DIAGNOSIS — M25551 Pain in right hip: Secondary | ICD-10-CM | POA: Diagnosis not present

## 2018-08-04 DIAGNOSIS — E46 Unspecified protein-calorie malnutrition: Secondary | ICD-10-CM | POA: Diagnosis not present

## 2018-08-04 DIAGNOSIS — R42 Dizziness and giddiness: Secondary | ICD-10-CM | POA: Diagnosis not present

## 2018-08-04 DIAGNOSIS — I4891 Unspecified atrial fibrillation: Secondary | ICD-10-CM | POA: Diagnosis not present

## 2018-08-04 DIAGNOSIS — I2510 Atherosclerotic heart disease of native coronary artery without angina pectoris: Secondary | ICD-10-CM | POA: Diagnosis not present

## 2018-08-04 DIAGNOSIS — E039 Hypothyroidism, unspecified: Secondary | ICD-10-CM | POA: Diagnosis not present

## 2018-08-04 DIAGNOSIS — M159 Polyosteoarthritis, unspecified: Secondary | ICD-10-CM | POA: Diagnosis not present

## 2018-08-21 DIAGNOSIS — M159 Polyosteoarthritis, unspecified: Secondary | ICD-10-CM | POA: Diagnosis not present

## 2018-08-21 DIAGNOSIS — I4891 Unspecified atrial fibrillation: Secondary | ICD-10-CM | POA: Diagnosis not present

## 2018-08-21 DIAGNOSIS — I2510 Atherosclerotic heart disease of native coronary artery without angina pectoris: Secondary | ICD-10-CM | POA: Diagnosis not present

## 2018-08-21 DIAGNOSIS — J069 Acute upper respiratory infection, unspecified: Secondary | ICD-10-CM | POA: Diagnosis not present

## 2018-08-21 DIAGNOSIS — H6123 Impacted cerumen, bilateral: Secondary | ICD-10-CM | POA: Diagnosis not present

## 2018-08-24 DIAGNOSIS — I493 Ventricular premature depolarization: Secondary | ICD-10-CM | POA: Diagnosis not present

## 2018-08-24 DIAGNOSIS — Z7901 Long term (current) use of anticoagulants: Secondary | ICD-10-CM | POA: Diagnosis not present

## 2018-08-24 DIAGNOSIS — I480 Paroxysmal atrial fibrillation: Secondary | ICD-10-CM | POA: Diagnosis not present

## 2018-08-24 DIAGNOSIS — I2510 Atherosclerotic heart disease of native coronary artery without angina pectoris: Secondary | ICD-10-CM | POA: Diagnosis not present

## 2018-08-26 ENCOUNTER — Other Ambulatory Visit: Payer: Self-pay

## 2018-08-26 ENCOUNTER — Encounter: Payer: Self-pay | Admitting: Neurology

## 2018-08-26 ENCOUNTER — Encounter: Payer: Self-pay | Admitting: *Deleted

## 2018-08-26 ENCOUNTER — Ambulatory Visit: Payer: Medicare Other | Admitting: Neurology

## 2018-08-26 VITALS — BP 144/69 | HR 57 | Temp 98.2°F | Ht 70.0 in | Wt 131.0 lb

## 2018-08-26 DIAGNOSIS — R413 Other amnesia: Secondary | ICD-10-CM | POA: Diagnosis not present

## 2018-08-26 DIAGNOSIS — I951 Orthostatic hypotension: Secondary | ICD-10-CM

## 2018-08-26 MED ORDER — MIDODRINE HCL 2.5 MG PO TABS: 2.5000 mg | ORAL_TABLET | Freq: Two times a day (BID) | ORAL | 11 refills | Status: DC

## 2018-08-26 NOTE — Progress Notes (Signed)
PATIENT: Justin [name] DOB: 04/25/1933  Chief Complaint  Patient presents with  . Dizziness    Orthostatic Vitals:  Lying: 144/69, 57, Sitting: 119/65,64, Standing: 81/52, 77, Standing x 3: 79/53, 78.  He is here with his daughter, Justin [name].  His dizziness is worse with positional changes.  He reports several falls.  He is working with his PCP for his unexplained weight loss.  Marland Kitchen PCP    Cher Nakai, MD     HISTORICAL  DORAL [name] is a 85 year old male, seen in request by his primary care physician Dr. Truman Hayward for evaluation of dizziness, he is accompanied by his daughter Justin [name] at today's clinic visit, initial evaluation was on August 26, 2018.  I have reviewed and summarized the referring note from the referring physician.  He had a past medical history of hyperlipidemia, atrial fibrillation, on chronic anticoagulation, on 5 mg twice a day, hypothyroidism, on supplement  He is a retired Administrator, used to work couple miles a day, because of knee pain, coronary artery disease, chest pain, he has stopped regular walking in 2017, has become very sedentary, following his posterior lumbar fusion L3 5, he was noted to have postsurgical confusion, gradual onset memory loss since then,  He began to complains of dizziness early 2020, fell in his bathroom when trying to get up in February 2020, his dizziness gradually getting worse, he complains of lightheadedness unsteady sensation when getting up quickly, "I could no longer jump off my bed in the morning".  He complains of lightheadedness, dizziness, was giving a prescription of fludrocortisone, with limited help, he denies bilateral lower extremity paresthesia   REVIEW OF SYSTEMS: Full 14 system review of systems performed and notable only for as above All other review of systems were negative.  ALLERGIES: Allergies  Allergen Reactions  . Bactrim [Sulfamethoxazole-Trimethoprim] Other (See Comments)    dizziness  . Adhesive [Tape] Rash   Must use paper tape Rash and itching  . Imdur [Isosorbide Mononitrate]     Headache  . Neosporin [Neomycin-Polymyxin-Gramicidin] Rash    HOME MEDICATIONS: Current Outpatient Medications  Medication Sig Dispense Refill  . apixaban (ELIQUIS) 2.5 MG TABS tablet Take 2.5 mg by mouth 2 (two) times daily.    . Bempedoic Acid 180 MG TABS Take 1 tablet by mouth. He is enrolled in a study right now.  He could potentially be on a placebo.    . bimatoprost (LUMIGAN) 0.03 % ophthalmic solution Place 1 drop into both eyes at bedtime.    . clonazePAM (KLONOPIN) 0.5 MG tablet Take 0.5 mg by mouth every 8 (eight) hours as needed for anxiety.     Marland Kitchen diltiazem (CARDIZEM CD) 120 MG 24 hr capsule Take 120 mg by mouth daily.    Marland Kitchen donepezil (ARICEPT) 10 MG tablet Take 10 mg by mouth at bedtime.    Marland Kitchen doxycycline (VIBRAMYCIN) 100 MG capsule Take 100 mg by mouth 2 (two) times daily.    . fludrocortisone (FLORINEF) 0.1 MG tablet Take 1 tablet (0.1 mg total) by mouth daily as needed (low blood pressure). 30 tablet 11  . fluticasone (FLONASE) 50 MCG/ACT nasal spray Place 2 sprays into both nostrils daily.     Marland Kitchen HYDROcodone-acetaminophen (NORCO) 7.5-325 MG tablet Take 1 tablet by mouth every 8 (eight) hours as needed for moderate pain.    Marland Kitchen levothyroxine (SYNTHROID, LEVOTHROID) 100 MCG tablet Take 100 mcg by mouth daily.      . meclizine (ANTIVERT) 25 MG tablet Take 25  mg by mouth every 6 (six) hours as needed for dizziness.    . nitroGLYCERIN (NITROSTAT) 0.4 MG SL tablet Place 1 tablet (0.4 mg total) under the tongue every 5 (five) minutes as needed for chest pain. 25 tablet 2  . simvastatin (ZOCOR) 10 MG tablet Take 10 mg by mouth every other day.     No current facility-administered medications for this visit.     PAST MEDICAL HISTORY: Past Medical History:  Diagnosis Date  . Anxiety    takes Klonopin daily as needed  . Arthritis   . BPH (benign prostatic hypertrophy)   . CAD (coronary artery disease)   .  Cataracts, bilateral    immature  . Chronic back pain    stenosis,ddd,and HNP  . COPD (chronic obstructive pulmonary disease) ([place])   . Depression   . Dizziness   . Enlarged prostate   . History of colon polyps   . HLD (hyperlipidemia)    takes Simvastatin nightly  . Hypotension   . Hypothyroidism    takes Synthroid daily  . Insomnia    doesn't take any meds  . Joint pain   . Memory loss     PAST SURGICAL HISTORY: Past Surgical History:  Procedure Laterality Date  . BACK SURGERY  2005, 2014  . CARDIAC CATHETERIZATION  11/2009   Had 3 previous cath. Most recent was in 2011. LAD: 40% mid stenosis, D1: 50 ostial , RCA: 20-30%. Normal EF  . CATARACT EXTRACTION, BILATERAL    . COLONOSCOPY    . ROTATOR CUFF REPAIR Right 1988  . TONSILLECTOMY     as a child    FAMILY HISTORY: Family History  Problem Relation Age of Onset  . Hypertension Mother   . Breast cancer Mother   . Asthma Father   . Heart attack Father   . Lung cancer Father   . Heart disease Other   . Heart attack Other   . Heart attack Maternal Uncle   . Heart attack Maternal Grandfather   . Stroke Neg Hx     SOCIAL HISTORY: Social History   Socioeconomic History  . Marital status: Married    Spouse name: Not on file  . Number of children: 2  . Years of education: 68  . Highest education level: High school graduate  Occupational History  . Occupation: Retired    Fish farm manager: [employer]  Social Needs  . Financial resource strain: Not on file  . Food insecurity    Worry: Not on file    Inability: Not on file  . Transportation needs    Medical: Not on file    Non-medical: Not on file  Tobacco Use  . Smoking status: Never Smoker  . Smokeless tobacco: Never Used  Substance and Sexual Activity  . Alcohol use: No  . Drug use: No  . Sexual activity: Not on file  Lifestyle  . Physical activity    Days per week: Not on file    Minutes per session: Not on file  . Stress: Not on file  Relationships  .  Social Herbalist on phone: Not on file    Gets together: Not on file    Attends religious service: Not on file    Active member of club or organization: Not on file    Attends meetings of clubs or organizations: Not on file    Relationship status: Not on file  . Intimate partner violence    Fear of current or ex  partner: Not on file    Emotionally abused: Not on file    Physically abused: Not on file    Forced sexual activity: Not on file  Other Topics Concern  . Not on file  Social History Narrative   Lives at home with his wife.   Right-handed.   No daily caffeine use.     PHYSICAL EXAM   Vitals:   08/26/18 1446  BP: (!) 144/69  Pulse: (!) 57  Temp: 98.2 F (36.8 C)  Weight: 131 lb (59.4 kg)  Height: 5\' 10"  (1.778 m)    Not recorded    Blood pressure lying down 144/69 heart rate of 57, sitting up 119/65 heart rate of 64, standing of 81/52 heart rate of 77, standing up for 5 minutes, 79/53 heart rate of 78  Body mass index is 18.8 kg/m.  PHYSICAL EXAMNIATION:  Gen: NAD, conversant, well nourised, obese, well groomed                     Cardiovascular: Regular rate rhythm, no peripheral edema, warm, nontender. Eyes: Conjunctivae clear without exudates or hemorrhage Neck: Supple, no carotid bruits. Pulmonary: Clear to auscultation bilaterally   NEUROLOGICAL EXAM:  MMSE - Mini Mental State Exam 08/26/2018  Orientation to time 3  Orientation to Place 3  Registration 3  Attention/ Calculation 1  Recall 0  Language- name 2 objects 2  Language- repeat 1  Language- follow 3 step command 2  Language- read & follow direction 1  Write a sentence 0  Copy design 0  Total score 16     CRANIAL NERVES: CN II: Visual fields are full to confrontation.  Pupils are round equal and briskly reactive to light. CN III, IV, VI: extraocular movement are normal. No ptosis. CN V: Facial sensation is intact to pinprick in all 3 divisions bilaterally. Corneal responses  are intact.  CN VII: Face is symmetric with normal eye closure and smile. CN VIII: Hearing is normal to rubbing fingers CN IX, X: Palate elevates symmetrically. Phonation is normal. CN XI: Head turning and shoulder shrug are intact CN XII: Tongue is midline with normal movements and no atrophy.  MOTOR: There is no pronator drift of out-stretched arms. Muscle bulk and tone are normal. Muscle strength is normal.  REFLEXES: Reflexes are 2+ and symmetric at the biceps, triceps, knees, and ankles. Plantar responses are flexor.  SENSORY: Intact to light touch, pinprick, positional sensation and vibratory sensation are intact in fingers and toes.  COORDINATION: Rapid alternating movements and fine finger movements are intact. There is no dysmetria on finger-to-nose and heel-knee-shin.    GAIT/STANCE: He needs push-up to get up from seated position, leaning forward, wide-based, cautious Romberg is absent.   DIAGNOSTIC DATA (LABS, IMAGING, TESTING) - I reviewed patient records, labs, notes, testing and imaging myself where available.   ASSESSMENT AND PLAN  RIGGS DINEEN is a 85 y.o. male   Orthostatic hypotension Dementia  Most suggestive of central nervous system degenerative disorder  MRI of brain  EMG nerve conduction study  Get laboratory evaluation from his primary care physician  Add on midodrine 2.5 mg twice a day   Marcial Pacas, M.D. Ph.D.  [facility] [address], [place], [place] [address] Ph: [phone] Fax: [phone]  CC: Referring Provider

## 2018-08-27 ENCOUNTER — Telehealth: Payer: Self-pay | Admitting: Neurology

## 2018-08-27 NOTE — Telephone Encounter (Signed)
[employer] medicare order sent to [facility]. No auth they will reach out to the patient to schedule.  

## 2018-09-04 DIAGNOSIS — I4891 Unspecified atrial fibrillation: Secondary | ICD-10-CM | POA: Diagnosis not present

## 2018-09-04 DIAGNOSIS — D649 Anemia, unspecified: Secondary | ICD-10-CM | POA: Diagnosis not present

## 2018-09-04 DIAGNOSIS — E46 Unspecified protein-calorie malnutrition: Secondary | ICD-10-CM | POA: Diagnosis not present

## 2018-09-04 DIAGNOSIS — I2510 Atherosclerotic heart disease of native coronary artery without angina pectoris: Secondary | ICD-10-CM | POA: Diagnosis not present

## 2018-09-04 DIAGNOSIS — M159 Polyosteoarthritis, unspecified: Secondary | ICD-10-CM | POA: Diagnosis not present

## 2018-09-18 DIAGNOSIS — I4891 Unspecified atrial fibrillation: Secondary | ICD-10-CM | POA: Diagnosis not present

## 2018-09-18 DIAGNOSIS — I2510 Atherosclerotic heart disease of native coronary artery without angina pectoris: Secondary | ICD-10-CM | POA: Diagnosis not present

## 2018-09-18 DIAGNOSIS — E46 Unspecified protein-calorie malnutrition: Secondary | ICD-10-CM | POA: Diagnosis not present

## 2018-09-18 DIAGNOSIS — M159 Polyosteoarthritis, unspecified: Secondary | ICD-10-CM | POA: Diagnosis not present

## 2018-09-18 DIAGNOSIS — M25552 Pain in left hip: Secondary | ICD-10-CM | POA: Diagnosis not present

## 2018-09-25 DIAGNOSIS — I2510 Atherosclerotic heart disease of native coronary artery without angina pectoris: Secondary | ICD-10-CM | POA: Diagnosis not present

## 2018-09-25 DIAGNOSIS — M25551 Pain in right hip: Secondary | ICD-10-CM | POA: Diagnosis not present

## 2018-09-25 DIAGNOSIS — I4891 Unspecified atrial fibrillation: Secondary | ICD-10-CM | POA: Diagnosis not present

## 2018-09-25 DIAGNOSIS — E46 Unspecified protein-calorie malnutrition: Secondary | ICD-10-CM | POA: Diagnosis not present

## 2018-09-25 DIAGNOSIS — M159 Polyosteoarthritis, unspecified: Secondary | ICD-10-CM | POA: Diagnosis not present

## 2018-09-28 ENCOUNTER — Ambulatory Visit
Admission: RE | Admit: 2018-09-28 | Discharge: 2018-09-28 | Disposition: A | Discharge status: Home / Self Care | Payer: Medicare Other | Source: Ambulatory Visit | Attending: Neurology | Admitting: Neurology

## 2018-09-28 DIAGNOSIS — R413 Other amnesia: Secondary | ICD-10-CM

## 2018-09-29 ENCOUNTER — Telehealth: Payer: Self-pay | Admitting: Neurology

## 2018-09-29 NOTE — Telephone Encounter (Signed)
Please call patient, MRI of the brain showed moderate generalized atrophy, corpus callosum atrophy, there is also advanced chronic kidney disease, this has progressed compared to previous MRI in 2014,    IMPRESSION: This MRI of the brain without contrast shows the following: 1.   Moderate generalized cortical atrophy and mild corpus callosum atrophy.  The atrophy is most pronounced in the anterior medial temporal lobes.  The atrophy has progressed compared to the 2014 MRI.   2.   Advanced chronic microvascular ischemic changes, progressed compared to the previous MRI. 3.   Chronic sinusitis as detailed above 4.   Vertebrobasilar and carotid dolichoectasia. 5.   There are no acute findings.

## 2018-09-29 NOTE — Telephone Encounter (Signed)
I have spoken with the patient's daughter and she has been informed of his MRI results.

## 2018-09-29 NOTE — Telephone Encounter (Signed)
Left message for his daughter to return my call Phoebe Sharps on [facility]).

## 2018-10-06 DIAGNOSIS — Z9181 History of falling: Secondary | ICD-10-CM | POA: Diagnosis not present

## 2018-10-06 DIAGNOSIS — E785 Hyperlipidemia, unspecified: Secondary | ICD-10-CM | POA: Diagnosis not present

## 2018-10-06 DIAGNOSIS — Z0000 Encounter for general adult medical examination without abnormal findings: Secondary | ICD-10-CM | POA: Diagnosis not present

## 2018-10-06 DIAGNOSIS — Z139 Encounter for screening, unspecified: Secondary | ICD-10-CM | POA: Diagnosis not present

## 2018-10-07 ENCOUNTER — Encounter: Payer: Self-pay | Admitting: Neurology

## 2018-10-09 DIAGNOSIS — I2510 Atherosclerotic heart disease of native coronary artery without angina pectoris: Secondary | ICD-10-CM | POA: Diagnosis not present

## 2018-10-09 DIAGNOSIS — M159 Polyosteoarthritis, unspecified: Secondary | ICD-10-CM | POA: Diagnosis not present

## 2018-10-09 DIAGNOSIS — I4891 Unspecified atrial fibrillation: Secondary | ICD-10-CM | POA: Diagnosis not present

## 2018-10-09 DIAGNOSIS — Z23 Encounter for immunization: Secondary | ICD-10-CM | POA: Diagnosis not present

## 2018-10-09 DIAGNOSIS — E46 Unspecified protein-calorie malnutrition: Secondary | ICD-10-CM | POA: Diagnosis not present

## 2018-10-22 DIAGNOSIS — E7800 Pure hypercholesterolemia, unspecified: Secondary | ICD-10-CM | POA: Diagnosis not present

## 2018-10-22 DIAGNOSIS — E039 Hypothyroidism, unspecified: Secondary | ICD-10-CM | POA: Diagnosis not present

## 2018-10-22 DIAGNOSIS — I4891 Unspecified atrial fibrillation: Secondary | ICD-10-CM | POA: Diagnosis not present

## 2018-10-22 DIAGNOSIS — I2510 Atherosclerotic heart disease of native coronary artery without angina pectoris: Secondary | ICD-10-CM | POA: Diagnosis not present

## 2018-10-22 DIAGNOSIS — M159 Polyosteoarthritis, unspecified: Secondary | ICD-10-CM | POA: Diagnosis not present

## 2018-10-22 DIAGNOSIS — I10 Essential (primary) hypertension: Secondary | ICD-10-CM | POA: Diagnosis not present

## 2018-10-22 DIAGNOSIS — E46 Unspecified protein-calorie malnutrition: Secondary | ICD-10-CM | POA: Diagnosis not present

## 2018-10-27 ENCOUNTER — Ambulatory Visit (INDEPENDENT_AMBULATORY_CARE_PROVIDER_SITE_OTHER): Payer: Medicare Other | Admitting: Neurology

## 2018-10-27 ENCOUNTER — Other Ambulatory Visit: Payer: Self-pay

## 2018-10-27 ENCOUNTER — Encounter: Payer: Self-pay | Admitting: Neurology

## 2018-10-27 VITALS — BP 96/52 | HR 75 | Temp 98.2°F | Ht 70.0 in | Wt 131.5 lb

## 2018-10-27 DIAGNOSIS — I679 Cerebrovascular disease, unspecified: Secondary | ICD-10-CM | POA: Diagnosis not present

## 2018-10-27 DIAGNOSIS — I951 Orthostatic hypotension: Secondary | ICD-10-CM | POA: Diagnosis not present

## 2018-10-27 DIAGNOSIS — R269 Unspecified abnormalities of gait and mobility: Secondary | ICD-10-CM | POA: Diagnosis not present

## 2018-10-27 DIAGNOSIS — R413 Other amnesia: Secondary | ICD-10-CM | POA: Diagnosis not present

## 2018-10-27 MED ORDER — MIDODRINE HCL 5 MG PO TABS: 5.0000 mg | ORAL_TABLET | Freq: Two times a day (BID) | ORAL | 11 refills | Status: AC

## 2018-10-27 MED ORDER — FLUDROCORTISONE ACETATE 0.1 MG PO TABS: 0.1000 mg | ORAL_TABLET | Freq: Every day | ORAL | 11 refills | Status: AC | PRN

## 2018-10-27 NOTE — Progress Notes (Signed)
PATIENT: Justin [name] DOB: 03/29/1933  Chief Complaint  Patient presents with   Dizziness    He is here with his daughter, Joelene Millin.  They would like to further review the MRI of his brain.  He has a pending NCV/EMG on 12/09/2018.  Feels midodrine has been some helpful for his symptoms.       HISTORICAL  Justin [name] is a 85 year old male, seen in request by his primary care physician Dr. Truman Hayward for evaluation of dizziness, he is accompanied by his daughter [name] at today's clinic visit, initial evaluation was on August 26, 2018.  I have reviewed and summarized the referring note from the referring physician.  He had a past medical history of hyperlipidemia, atrial fibrillation, on chronic anticoagulation, on 5 mg twice a day, hypothyroidism, on supplement  He is a retired Administrator, used to work couple miles a day, because of knee pain, coronary artery disease, chest pain, he has stopped regular walking in 2017, has become very sedentary, following his posterior lumbar fusion L3 5, he was noted to have postsurgical confusion, gradual onset memory loss since then,  He began to complains of dizziness early 2020, fell in his bathroom when trying to get up in February 2020, his dizziness gradually getting worse, he complains of lightheadedness unsteady sensation when getting up quickly, "I could no longer jump off my bed in the morning".  He complains of lightheadedness, dizziness, was given a prescription of fludrocortisone, with limited help, he denies bilateral lower extremity paresthesia  UPDATE Sept 15 2020: We personally reviewed MRI of the brain in August 2020, moderate generalized atrophy, and mild corpus callosum atrophy, advanced supratentorium small vessel disease  He continue complains of generalized fatigue, weakness, especially during early mornings, getting worse while he gets up from seated position, he is now taking midodrine 2.5 mg twice a day, which has been helpful  only take fludrocortisone 0.1 mg as needed, today's blood pressure is 96/62  REVIEW OF SYSTEMS: Full 14 system review of systems performed and notable only for as above All other review of systems were negative.  ALLERGIES: Allergies  Allergen Reactions   Bactrim [Sulfamethoxazole-Trimethoprim] Other (See Comments)    dizziness   Adhesive [Tape] Rash    Must use paper tape Rash and itching   Imdur [Isosorbide Mononitrate]     Headache   Neosporin [Neomycin-Polymyxin-Gramicidin] Rash    HOME MEDICATIONS: Current Outpatient Medications  Medication Sig Dispense Refill   apixaban (ELIQUIS) 2.5 MG TABS tablet Take 2.5 mg by mouth 2 (two) times daily.     Bempedoic Acid 180 MG TABS Take 1 tablet by mouth. He is enrolled in a study right now.  He could potentially be on a placebo.     bimatoprost (LUMIGAN) 0.03 % ophthalmic solution Place 1 drop into both eyes at bedtime.     clonazePAM (KLONOPIN) 0.5 MG tablet Take 0.5 mg by mouth every 8 (eight) hours as needed for anxiety.      diltiazem (CARDIZEM CD) 120 MG 24 hr capsule Take 120 mg by mouth daily.     donepezil (ARICEPT) 10 MG tablet Take 10 mg by mouth at bedtime.     fludrocortisone (FLORINEF) 0.1 MG tablet Take 1 tablet (0.1 mg total) by mouth daily as needed (low blood pressure). 30 tablet 11   fluticasone (FLONASE) 50 MCG/ACT nasal spray Place 2 sprays into both nostrils daily.      HYDROcodone-acetaminophen (NORCO) 7.5-325 MG tablet Take 1 tablet by  mouth every 8 (eight) hours as needed for moderate pain.     levothyroxine (SYNTHROID, LEVOTHROID) 100 MCG tablet Take 100 mcg by mouth daily.       meclizine (ANTIVERT) 25 MG tablet Take 25 mg by mouth every 6 (six) hours as needed for dizziness.     midodrine (PROAMATINE) 2.5 MG tablet Take 1 tablet (2.5 mg total) by mouth 2 (two) times daily with a meal. 60 tablet 11   nitroGLYCERIN (NITROSTAT) 0.4 MG SL tablet Place 1 tablet (0.4 mg total) under the tongue every 5  (five) minutes as needed for chest pain. 25 tablet 2   simvastatin (ZOCOR) 10 MG tablet Take 10 mg by mouth every other day.     No current facility-administered medications for this visit.     PAST MEDICAL HISTORY: Past Medical History:  Diagnosis Date   Anxiety    takes Klonopin daily as needed   Arthritis    BPH (benign prostatic hypertrophy)    CAD (coronary artery disease)    Cataracts, bilateral    immature   Chronic back pain    stenosis,ddd,and HNP   COPD (chronic obstructive pulmonary disease) ([facility])    Depression    Dizziness    Enlarged prostate    History of colon polyps    HLD (hyperlipidemia)    takes Simvastatin nightly   Hypotension    Hypothyroidism    takes Synthroid daily   Insomnia    doesn't take any meds   Joint pain    Memory loss     PAST SURGICAL HISTORY: Past Surgical History:  Procedure Laterality Date   BACK SURGERY  2005, 2014   CARDIAC CATHETERIZATION  11/2009   Had 3 previous cath. Most recent was in 2011. LAD: 40% mid stenosis, D1: 50 ostial , RCA: 20-30%. Normal EF   CATARACT EXTRACTION, BILATERAL     COLONOSCOPY     ROTATOR CUFF REPAIR Right 1988   TONSILLECTOMY     as a child    FAMILY HISTORY: Family History  Problem Relation Age of Onset   Hypertension Mother    Breast cancer Mother    Asthma Father    Heart attack Father    Lung cancer Father    Heart disease Other    Heart attack Other    Heart attack Maternal Uncle    Heart attack Maternal Grandfather    Stroke Neg Hx     SOCIAL HISTORY: Social History   Socioeconomic History   Marital status: Married    Spouse name: Not on file   Number of children: 2   Years of education: 12   Highest education level: High school graduate  Occupational History   Occupation: Retired    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 strain: Not on file   Food insecurity    Worry: Not on file    Inability: Not on file    Transportation needs    Medical: Not on file    Non-medical: Not on file  Tobacco Use   Smoking status: Never Smoker   Smokeless tobacco: Never Used  Substance and Sexual Activity   Alcohol use: No   Drug use: No   Sexual activity: Not on file  Lifestyle   Physical activity    Days per week: Not on file    Minutes per session: Not on file   Stress: Not on file  Relationships   Social connections    Talks on phone: Not  on file    Gets together: Not on file    Attends religious service: Not on file    Active member of club or organization: Not on file    Attends meetings of clubs or organizations: Not on file    Relationship status: Not on file   Intimate partner violence    Fear of current or ex partner: Not on file    Emotionally abused: Not on file    Physically abused: Not on file    Forced sexual activity: Not on file  Other Topics Concern   Not on file  Social History Narrative   Lives at home with his wife.   Right-handed.   No daily caffeine use.     PHYSICAL EXAM   Vitals:   10/27/18 1309  BP: (!) 96/52  Pulse: 75  Temp: 98.2 F (36.8 C)  Weight: 131 lb 8 oz (59.6 kg)  Height: 5\' 10"  (1.778 m)    Body mass index is 18.87 kg/m.  PHYSICAL EXAMNIATION:  Gen: NAD, conversant, well nourised, obese, well groomed                     Cardiovascular: Regular rate rhythm, no peripheral edema, warm, nontender. Eyes: Conjunctivae clear without exudates or hemorrhage Neck: Supple, no carotid bruits. Pulmonary: Clear to auscultation bilaterally   NEUROLOGICAL EXAM:  MMSE - Mini Mental State Exam 08/26/2018  Orientation to time 3  Orientation to Place 3  Registration 3  Attention/ Calculation 1  Recall 0  Language- name 2 objects 2  Language- repeat 1  Language- follow 3 step command 2  Language- read & follow direction 1  Write a sentence 0  Copy design 0  Total score 16     CRANIAL NERVES: CN II: Visual fields are full to confrontation.   Pupils are round equal and briskly reactive to light. CN III, IV, VI: extraocular movement are normal. No ptosis. CN V: Facial sensation is intact to pinprick in all 3 divisions bilaterally. Corneal responses are intact.  CN VII: Face is symmetric with normal eye closure and smile. CN VIII: Hearing is normal to rubbing fingers CN IX, X: Palate elevates symmetrically. Phonation is normal. CN XI: Head turning and shoulder shrug are intact CN XII: Tongue is midline with normal movements and no atrophy.  MOTOR: No resting tremor, motor strength is normal,  REFLEXES: Reflexes are 1 and symmetric at the biceps, triceps, knees, and ankles. Plantar responses are flexor.  SENSORY: Mildly length dependent decreased to light touch, pinprick, and vibratory sensation to ankle level  COORDINATION: Rapid alternating movements and fine finger movements are intact. There is no dysmetria on finger-to-nose and heel-knee-shin.    GAIT/STANCE: He needs push-up to get up from seated position, leaning forward, wide-based, cautious Romberg is absent.   DIAGNOSTIC DATA (LABS, IMAGING, TESTING) - I reviewed patient records, labs, notes, testing and imaging myself where available.   ASSESSMENT AND PLAN  Justin [name] is a 85 y.o. male   Orthostatic hypotension Cerebrovascular disease Dementia  Most suggestive of central nervous system degenerative disorder likely a component of cerebrovascular disease  MRI of brain showed significant atrophy, moderate supratentorium small vessel disease  Get laboratory evaluations from his primary care physician  His symptoms improved with low-dose midodrine 2.5 mg twice a day will increase to 5 mg twice a day  Continue fludrocortisone 0.1 mg daily  Ultrasound of carotid artery  Echocardiogram   Marcial Pacas, M.D. Ph.D.  [facility] [address], [place], [place] [address] Ph: [phone] Fax: [phone]  CC: Cher Nakai, MD

## 2018-11-16 DIAGNOSIS — H43813 Vitreous degeneration, bilateral: Secondary | ICD-10-CM | POA: Diagnosis not present

## 2018-11-16 DIAGNOSIS — H5203 Hypermetropia, bilateral: Secondary | ICD-10-CM | POA: Diagnosis not present

## 2018-11-16 DIAGNOSIS — H401233 Low-tension glaucoma, bilateral, severe stage: Secondary | ICD-10-CM | POA: Diagnosis not present

## 2018-11-16 DIAGNOSIS — H52203 Unspecified astigmatism, bilateral: Secondary | ICD-10-CM | POA: Diagnosis not present

## 2018-11-16 DIAGNOSIS — H18413 Arcus senilis, bilateral: Secondary | ICD-10-CM | POA: Diagnosis not present

## 2018-11-19 DIAGNOSIS — B9689 Other specified bacterial agents as the cause of diseases classified elsewhere: Secondary | ICD-10-CM | POA: Diagnosis not present

## 2018-11-19 DIAGNOSIS — J208 Acute bronchitis due to other specified organisms: Secondary | ICD-10-CM | POA: Diagnosis not present

## 2018-11-19 DIAGNOSIS — N183 Chronic kidney disease, stage 3 unspecified: Secondary | ICD-10-CM | POA: Diagnosis not present

## 2018-11-19 DIAGNOSIS — I2510 Atherosclerotic heart disease of native coronary artery without angina pectoris: Secondary | ICD-10-CM | POA: Diagnosis not present

## 2018-11-19 DIAGNOSIS — Z23 Encounter for immunization: Secondary | ICD-10-CM | POA: Diagnosis not present

## 2018-11-23 ENCOUNTER — Telehealth: Payer: Self-pay | Admitting: Neurology

## 2018-11-23 ENCOUNTER — Ambulatory Visit (HOSPITAL_COMMUNITY)
Admission: RE | Admit: 2018-11-23 | Discharge: 2018-11-23 | Disposition: A | Discharge status: Home / Self Care | Payer: Medicare Other | Source: Ambulatory Visit | Attending: Neurology | Admitting: Neurology

## 2018-11-23 ENCOUNTER — Other Ambulatory Visit: Payer: Self-pay

## 2018-11-23 ENCOUNTER — Ambulatory Visit (HOSPITAL_BASED_OUTPATIENT_CLINIC_OR_DEPARTMENT_OTHER)
Admission: RE | Admit: 2018-11-23 | Discharge: 2018-11-23 | Disposition: A | Discharge status: Home / Self Care | Payer: Medicare Other | Source: Ambulatory Visit | Attending: Neurology | Admitting: Neurology

## 2018-11-23 DIAGNOSIS — I679 Cerebrovascular disease, unspecified: Secondary | ICD-10-CM

## 2018-11-23 DIAGNOSIS — I2510 Atherosclerotic heart disease of native coronary artery without angina pectoris: Secondary | ICD-10-CM | POA: Diagnosis not present

## 2018-11-23 DIAGNOSIS — R413 Other amnesia: Secondary | ICD-10-CM

## 2018-11-23 DIAGNOSIS — I083 Combined rheumatic disorders of mitral, aortic and tricuspid valves: Secondary | ICD-10-CM | POA: Insufficient documentation

## 2018-11-23 DIAGNOSIS — I951 Orthostatic hypotension: Secondary | ICD-10-CM | POA: Insufficient documentation

## 2018-11-23 DIAGNOSIS — R269 Unspecified abnormalities of gait and mobility: Secondary | ICD-10-CM | POA: Insufficient documentation

## 2018-11-23 NOTE — Telephone Encounter (Signed)
Left patient's daughter, Joelene Millin, a detailed message, with results, on voicemail (ok per DPR).  Provided our number to call back with any questions.

## 2018-11-23 NOTE — Telephone Encounter (Signed)
Open in error

## 2018-11-23 NOTE — Telephone Encounter (Signed)
Please call patient, bilateral carotid artery showed less than 39% stenosis, continue current management  Right Carotid: Velocities in the right ICA are consistent with a 1-39% stenosis.  Left Carotid: Velocities in the left ICA are consistent with a 1-39% stenosis.

## 2018-11-23 NOTE — Progress Notes (Signed)
  Echocardiogram 2D Echocardiogram has been performed.  Justin [name] 11/23/2018, 2:34 PM

## 2018-11-24 ENCOUNTER — Telehealth: Payer: Self-pay | Admitting: Neurology

## 2018-11-24 NOTE — Telephone Encounter (Signed)
Please call patient, echocardiogram showed ejection fraction 50 to 55%.  No significant abnormality.   FINDINGS  Left Ventricle: Left ventricular ejection fraction, by visual estimation, is 50 to 55%. The left ventricle has normal function. There is no left ventricular hypertrophy. Mildly increased left ventricular size. Spectral Doppler shows Left ventricular  diastolic Doppler parameters are consistent with pseudonormalization pattern of LV diastolic filling. Normal left ventricular filling pressures

## 2018-11-24 NOTE — Telephone Encounter (Signed)
I was able to speak with the patient's daughter on DPR. She verbalized understanding of the ECHO results below.

## 2018-11-24 NOTE — Telephone Encounter (Signed)
I spoke to the patient's daughter on DPR and she verbalized understanding of his results.

## 2018-11-24 NOTE — Telephone Encounter (Signed)
Please call patient, US carotid showed less than 39% stenosis bilaterally, bilateral vertebral arteries are anterograde flow   Summary: Right Carotid: Velocities in the right ICA are consistent with a 1-39% stenosis.  Left Carotid: Velocities in the left ICA are consistent with a 1-39% stenosis.  Vertebrals:  Bilateral vertebral arteries demonstrate antegrade flow. Subclavians: Normal flow hemodynamics were seen in bilateral subclavian              arteries.  *See table(s) above for measurements and observations.

## 2018-11-27 DIAGNOSIS — J208 Acute bronchitis due to other specified organisms: Secondary | ICD-10-CM | POA: Diagnosis not present

## 2018-11-27 DIAGNOSIS — N183 Chronic kidney disease, stage 3 unspecified: Secondary | ICD-10-CM | POA: Diagnosis not present

## 2018-11-27 DIAGNOSIS — I4891 Unspecified atrial fibrillation: Secondary | ICD-10-CM | POA: Diagnosis not present

## 2018-11-27 DIAGNOSIS — Z23 Encounter for immunization: Secondary | ICD-10-CM | POA: Diagnosis not present

## 2018-11-27 DIAGNOSIS — I2510 Atherosclerotic heart disease of native coronary artery without angina pectoris: Secondary | ICD-10-CM | POA: Diagnosis not present

## 2018-12-09 ENCOUNTER — Encounter: Payer: Self-pay | Admitting: Neurology

## 2018-12-11 DIAGNOSIS — N183 Chronic kidney disease, stage 3 unspecified: Secondary | ICD-10-CM | POA: Diagnosis not present

## 2018-12-11 DIAGNOSIS — M25551 Pain in right hip: Secondary | ICD-10-CM | POA: Diagnosis not present

## 2018-12-11 DIAGNOSIS — I4891 Unspecified atrial fibrillation: Secondary | ICD-10-CM | POA: Diagnosis not present

## 2018-12-11 DIAGNOSIS — J209 Acute bronchitis, unspecified: Secondary | ICD-10-CM | POA: Diagnosis not present

## 2018-12-11 DIAGNOSIS — I2510 Atherosclerotic heart disease of native coronary artery without angina pectoris: Secondary | ICD-10-CM | POA: Diagnosis not present

## 2018-12-16 DIAGNOSIS — M159 Polyosteoarthritis, unspecified: Secondary | ICD-10-CM | POA: Diagnosis not present

## 2018-12-16 DIAGNOSIS — N183 Chronic kidney disease, stage 3 unspecified: Secondary | ICD-10-CM | POA: Diagnosis not present

## 2018-12-16 DIAGNOSIS — I2510 Atherosclerotic heart disease of native coronary artery without angina pectoris: Secondary | ICD-10-CM | POA: Diagnosis not present

## 2018-12-16 DIAGNOSIS — I4891 Unspecified atrial fibrillation: Secondary | ICD-10-CM | POA: Diagnosis not present

## 2018-12-16 DIAGNOSIS — R42 Dizziness and giddiness: Secondary | ICD-10-CM | POA: Diagnosis not present

## 2018-12-25 DIAGNOSIS — I2510 Atherosclerotic heart disease of native coronary artery without angina pectoris: Secondary | ICD-10-CM | POA: Diagnosis not present

## 2018-12-25 DIAGNOSIS — I4891 Unspecified atrial fibrillation: Secondary | ICD-10-CM | POA: Diagnosis not present

## 2018-12-25 DIAGNOSIS — M159 Polyosteoarthritis, unspecified: Secondary | ICD-10-CM | POA: Diagnosis not present

## 2018-12-25 DIAGNOSIS — N183 Chronic kidney disease, stage 3 unspecified: Secondary | ICD-10-CM | POA: Diagnosis not present

## 2019-01-15 DIAGNOSIS — N183 Chronic kidney disease, stage 3 unspecified: Secondary | ICD-10-CM | POA: Diagnosis not present

## 2019-01-15 DIAGNOSIS — J300 Vasomotor rhinitis: Secondary | ICD-10-CM | POA: Diagnosis not present

## 2019-01-15 DIAGNOSIS — I2510 Atherosclerotic heart disease of native coronary artery without angina pectoris: Secondary | ICD-10-CM | POA: Diagnosis not present

## 2019-01-15 DIAGNOSIS — I4891 Unspecified atrial fibrillation: Secondary | ICD-10-CM | POA: Diagnosis not present

## 2019-01-25 DIAGNOSIS — I2510 Atherosclerotic heart disease of native coronary artery without angina pectoris: Secondary | ICD-10-CM | POA: Diagnosis not present

## 2019-01-25 DIAGNOSIS — J208 Acute bronchitis due to other specified organisms: Secondary | ICD-10-CM | POA: Diagnosis not present

## 2019-01-25 DIAGNOSIS — D649 Anemia, unspecified: Secondary | ICD-10-CM | POA: Diagnosis not present

## 2019-01-25 DIAGNOSIS — E7800 Pure hypercholesterolemia, unspecified: Secondary | ICD-10-CM | POA: Diagnosis not present

## 2019-01-25 DIAGNOSIS — J300 Vasomotor rhinitis: Secondary | ICD-10-CM | POA: Diagnosis not present

## 2019-01-25 DIAGNOSIS — I10 Essential (primary) hypertension: Secondary | ICD-10-CM | POA: Diagnosis not present

## 2019-01-25 DIAGNOSIS — N183 Chronic kidney disease, stage 3 unspecified: Secondary | ICD-10-CM | POA: Diagnosis not present

## 2019-02-22 DIAGNOSIS — I4891 Unspecified atrial fibrillation: Secondary | ICD-10-CM | POA: Diagnosis not present

## 2019-02-22 DIAGNOSIS — M159 Polyosteoarthritis, unspecified: Secondary | ICD-10-CM | POA: Diagnosis not present

## 2019-02-22 DIAGNOSIS — M25551 Pain in right hip: Secondary | ICD-10-CM | POA: Diagnosis not present

## 2019-02-22 DIAGNOSIS — N183 Chronic kidney disease, stage 3 unspecified: Secondary | ICD-10-CM | POA: Diagnosis not present

## 2019-02-22 DIAGNOSIS — I2510 Atherosclerotic heart disease of native coronary artery without angina pectoris: Secondary | ICD-10-CM | POA: Diagnosis not present

## 2019-03-01 DIAGNOSIS — N183 Chronic kidney disease, stage 3 unspecified: Secondary | ICD-10-CM | POA: Diagnosis not present

## 2019-03-01 DIAGNOSIS — I4891 Unspecified atrial fibrillation: Secondary | ICD-10-CM | POA: Diagnosis not present

## 2019-03-01 DIAGNOSIS — M159 Polyosteoarthritis, unspecified: Secondary | ICD-10-CM | POA: Diagnosis not present

## 2019-03-01 DIAGNOSIS — M25552 Pain in left hip: Secondary | ICD-10-CM | POA: Diagnosis not present

## 2019-03-01 DIAGNOSIS — I2510 Atherosclerotic heart disease of native coronary artery without angina pectoris: Secondary | ICD-10-CM | POA: Diagnosis not present

## 2019-03-10 DIAGNOSIS — R42 Dizziness and giddiness: Secondary | ICD-10-CM | POA: Diagnosis not present

## 2019-03-10 DIAGNOSIS — Z7901 Long term (current) use of anticoagulants: Secondary | ICD-10-CM | POA: Diagnosis not present

## 2019-03-10 DIAGNOSIS — R451 Restlessness and agitation: Secondary | ICD-10-CM | POA: Diagnosis not present

## 2019-03-10 DIAGNOSIS — I2510 Atherosclerotic heart disease of native coronary artery without angina pectoris: Secondary | ICD-10-CM | POA: Diagnosis not present

## 2019-03-10 DIAGNOSIS — S322XXA Fracture of coccyx, initial encounter for closed fracture: Secondary | ICD-10-CM | POA: Diagnosis not present

## 2019-03-10 DIAGNOSIS — W19XXXA Unspecified fall, initial encounter: Secondary | ICD-10-CM | POA: Diagnosis not present

## 2019-03-10 DIAGNOSIS — I361 Nonrheumatic tricuspid (valve) insufficiency: Secondary | ICD-10-CM | POA: Diagnosis not present

## 2019-03-10 DIAGNOSIS — Z03818 Encounter for observation for suspected exposure to other biological agents ruled out: Secondary | ICD-10-CM | POA: Diagnosis not present

## 2019-03-10 DIAGNOSIS — R5381 Other malaise: Secondary | ICD-10-CM | POA: Diagnosis not present

## 2019-03-10 DIAGNOSIS — R7989 Other specified abnormal findings of blood chemistry: Secondary | ICD-10-CM | POA: Diagnosis not present

## 2019-03-10 DIAGNOSIS — G4700 Insomnia, unspecified: Secondary | ICD-10-CM | POA: Diagnosis not present

## 2019-03-10 DIAGNOSIS — Z9181 History of falling: Secondary | ICD-10-CM | POA: Diagnosis not present

## 2019-03-10 DIAGNOSIS — D649 Anemia, unspecified: Secondary | ICD-10-CM | POA: Diagnosis not present

## 2019-03-10 DIAGNOSIS — R410 Disorientation, unspecified: Secondary | ICD-10-CM | POA: Diagnosis not present

## 2019-03-10 DIAGNOSIS — S0990XA Unspecified injury of head, initial encounter: Secondary | ICD-10-CM | POA: Diagnosis not present

## 2019-03-10 DIAGNOSIS — M160 Bilateral primary osteoarthritis of hip: Secondary | ICD-10-CM | POA: Diagnosis not present

## 2019-03-10 DIAGNOSIS — S199XXA Unspecified injury of neck, initial encounter: Secondary | ICD-10-CM | POA: Diagnosis not present

## 2019-03-10 DIAGNOSIS — I951 Orthostatic hypotension: Secondary | ICD-10-CM | POA: Diagnosis not present

## 2019-03-10 DIAGNOSIS — R296 Repeated falls: Secondary | ICD-10-CM | POA: Diagnosis not present

## 2019-03-10 DIAGNOSIS — I480 Paroxysmal atrial fibrillation: Secondary | ICD-10-CM | POA: Diagnosis not present

## 2019-03-10 DIAGNOSIS — I952 Hypotension due to drugs: Secondary | ICD-10-CM | POA: Diagnosis not present

## 2019-03-10 DIAGNOSIS — D638 Anemia in other chronic diseases classified elsewhere: Secondary | ICD-10-CM | POA: Diagnosis not present

## 2019-03-10 DIAGNOSIS — T50915A Adverse effect of multiple unspecified drugs, medicaments and biological substances, initial encounter: Secondary | ICD-10-CM | POA: Diagnosis not present

## 2019-03-10 DIAGNOSIS — I10 Essential (primary) hypertension: Secondary | ICD-10-CM | POA: Diagnosis not present

## 2019-03-10 DIAGNOSIS — E039 Hypothyroidism, unspecified: Secondary | ICD-10-CM | POA: Diagnosis not present

## 2019-03-10 DIAGNOSIS — E785 Hyperlipidemia, unspecified: Secondary | ICD-10-CM | POA: Diagnosis not present

## 2019-03-10 DIAGNOSIS — R4182 Altered mental status, unspecified: Secondary | ICD-10-CM

## 2019-03-10 DIAGNOSIS — J439 Emphysema, unspecified: Secondary | ICD-10-CM | POA: Diagnosis not present

## 2019-03-10 DIAGNOSIS — I959 Hypotension, unspecified: Secondary | ICD-10-CM | POA: Diagnosis not present

## 2019-03-10 DIAGNOSIS — E7800 Pure hypercholesterolemia, unspecified: Secondary | ICD-10-CM | POA: Diagnosis not present

## 2019-03-10 DIAGNOSIS — Z743 Need for continuous supervision: Secondary | ICD-10-CM | POA: Diagnosis not present

## 2019-03-10 DIAGNOSIS — J449 Chronic obstructive pulmonary disease, unspecified: Secondary | ICD-10-CM | POA: Diagnosis not present

## 2019-03-10 DIAGNOSIS — Z79899 Other long term (current) drug therapy: Secondary | ICD-10-CM | POA: Diagnosis not present

## 2019-03-10 DIAGNOSIS — I340 Nonrheumatic mitral (valve) insufficiency: Secondary | ICD-10-CM | POA: Diagnosis not present

## 2019-03-10 DIAGNOSIS — M1990 Unspecified osteoarthritis, unspecified site: Secondary | ICD-10-CM | POA: Diagnosis not present

## 2019-03-11 DIAGNOSIS — R4182 Altered mental status, unspecified: Secondary | ICD-10-CM | POA: Diagnosis not present

## 2019-03-11 DIAGNOSIS — I10 Essential (primary) hypertension: Secondary | ICD-10-CM | POA: Diagnosis not present

## 2019-03-11 DIAGNOSIS — I480 Paroxysmal atrial fibrillation: Secondary | ICD-10-CM | POA: Diagnosis not present

## 2019-03-12 DIAGNOSIS — J439 Emphysema, unspecified: Secondary | ICD-10-CM | POA: Diagnosis not present

## 2019-03-12 DIAGNOSIS — R451 Restlessness and agitation: Secondary | ICD-10-CM | POA: Diagnosis not present

## 2019-03-12 DIAGNOSIS — R4182 Altered mental status, unspecified: Secondary | ICD-10-CM | POA: Diagnosis not present

## 2019-03-12 DIAGNOSIS — E785 Hyperlipidemia, unspecified: Secondary | ICD-10-CM | POA: Diagnosis not present

## 2019-03-12 DIAGNOSIS — E039 Hypothyroidism, unspecified: Secondary | ICD-10-CM | POA: Diagnosis not present

## 2019-03-12 DIAGNOSIS — I10 Essential (primary) hypertension: Secondary | ICD-10-CM | POA: Diagnosis not present

## 2019-03-12 DIAGNOSIS — Z743 Need for continuous supervision: Secondary | ICD-10-CM | POA: Diagnosis not present

## 2019-03-12 DIAGNOSIS — J449 Chronic obstructive pulmonary disease, unspecified: Secondary | ICD-10-CM | POA: Diagnosis not present

## 2019-03-12 DIAGNOSIS — G4700 Insomnia, unspecified: Secondary | ICD-10-CM | POA: Diagnosis not present

## 2019-03-12 DIAGNOSIS — S0003XA Contusion of scalp, initial encounter: Secondary | ICD-10-CM | POA: Diagnosis not present

## 2019-03-12 DIAGNOSIS — D649 Anemia, unspecified: Secondary | ICD-10-CM | POA: Diagnosis not present

## 2019-03-12 DIAGNOSIS — R0902 Hypoxemia: Secondary | ICD-10-CM | POA: Diagnosis not present

## 2019-03-12 DIAGNOSIS — Z7401 Bed confinement status: Secondary | ICD-10-CM | POA: Diagnosis not present

## 2019-03-12 DIAGNOSIS — T148XXA Other injury of unspecified body region, initial encounter: Secondary | ICD-10-CM | POA: Diagnosis not present

## 2019-03-12 DIAGNOSIS — R519 Headache, unspecified: Secondary | ICD-10-CM | POA: Diagnosis not present

## 2019-03-12 DIAGNOSIS — R9431 Abnormal electrocardiogram [ECG] [EKG]: Secondary | ICD-10-CM | POA: Diagnosis not present

## 2019-03-12 DIAGNOSIS — R296 Repeated falls: Secondary | ICD-10-CM | POA: Diagnosis not present

## 2019-03-12 DIAGNOSIS — E7800 Pure hypercholesterolemia, unspecified: Secondary | ICD-10-CM | POA: Diagnosis not present

## 2019-03-12 DIAGNOSIS — I2510 Atherosclerotic heart disease of native coronary artery without angina pectoris: Secondary | ICD-10-CM | POA: Diagnosis not present

## 2019-03-12 DIAGNOSIS — S0093XA Contusion of unspecified part of head, initial encounter: Secondary | ICD-10-CM | POA: Diagnosis not present

## 2019-03-12 DIAGNOSIS — S7012XA Contusion of left thigh, initial encounter: Secondary | ICD-10-CM | POA: Diagnosis not present

## 2019-03-12 DIAGNOSIS — R52 Pain, unspecified: Secondary | ICD-10-CM | POA: Diagnosis not present

## 2019-03-12 DIAGNOSIS — S199XXA Unspecified injury of neck, initial encounter: Secondary | ICD-10-CM | POA: Diagnosis not present

## 2019-03-12 DIAGNOSIS — I480 Paroxysmal atrial fibrillation: Secondary | ICD-10-CM | POA: Diagnosis not present

## 2019-03-12 DIAGNOSIS — R58 Hemorrhage, not elsewhere classified: Secondary | ICD-10-CM | POA: Diagnosis not present

## 2019-03-12 DIAGNOSIS — M2550 Pain in unspecified joint: Secondary | ICD-10-CM | POA: Diagnosis not present

## 2019-03-12 DIAGNOSIS — R799 Abnormal finding of blood chemistry, unspecified: Secondary | ICD-10-CM | POA: Diagnosis not present

## 2019-03-12 DIAGNOSIS — R410 Disorientation, unspecified: Secondary | ICD-10-CM | POA: Diagnosis not present

## 2019-03-12 DIAGNOSIS — R400 Somnolence: Secondary | ICD-10-CM | POA: Diagnosis not present

## 2019-03-12 DIAGNOSIS — S4992XA Unspecified injury of left shoulder and upper arm, initial encounter: Secondary | ICD-10-CM | POA: Diagnosis not present

## 2019-03-12 DIAGNOSIS — R1310 Dysphagia, unspecified: Secondary | ICD-10-CM | POA: Diagnosis not present

## 2019-03-12 DIAGNOSIS — I951 Orthostatic hypotension: Secondary | ICD-10-CM | POA: Diagnosis not present

## 2019-03-12 DIAGNOSIS — W19XXXA Unspecified fall, initial encounter: Secondary | ICD-10-CM | POA: Diagnosis not present

## 2019-03-12 DIAGNOSIS — S8992XA Unspecified injury of left lower leg, initial encounter: Secondary | ICD-10-CM | POA: Diagnosis not present

## 2019-03-12 DIAGNOSIS — S6992XA Unspecified injury of left wrist, hand and finger(s), initial encounter: Secondary | ICD-10-CM | POA: Diagnosis not present

## 2019-03-12 DIAGNOSIS — S134XXA Sprain of ligaments of cervical spine, initial encounter: Secondary | ICD-10-CM | POA: Diagnosis not present

## 2019-03-12 DIAGNOSIS — S59912A Unspecified injury of left forearm, initial encounter: Secondary | ICD-10-CM | POA: Diagnosis not present

## 2019-03-12 DIAGNOSIS — R918 Other nonspecific abnormal finding of lung field: Secondary | ICD-10-CM | POA: Diagnosis not present

## 2019-03-13 DIAGNOSIS — S8992XA Unspecified injury of left lower leg, initial encounter: Secondary | ICD-10-CM | POA: Diagnosis not present

## 2019-03-13 DIAGNOSIS — S4992XA Unspecified injury of left shoulder and upper arm, initial encounter: Secondary | ICD-10-CM | POA: Diagnosis not present

## 2019-03-13 DIAGNOSIS — S0003XA Contusion of scalp, initial encounter: Secondary | ICD-10-CM | POA: Diagnosis not present

## 2019-03-13 DIAGNOSIS — S6992XA Unspecified injury of left wrist, hand and finger(s), initial encounter: Secondary | ICD-10-CM | POA: Diagnosis not present

## 2019-03-13 DIAGNOSIS — T148XXA Other injury of unspecified body region, initial encounter: Secondary | ICD-10-CM | POA: Diagnosis not present

## 2019-03-13 DIAGNOSIS — S7012XA Contusion of left thigh, initial encounter: Secondary | ICD-10-CM | POA: Diagnosis not present

## 2019-03-13 DIAGNOSIS — S199XXA Unspecified injury of neck, initial encounter: Secondary | ICD-10-CM | POA: Diagnosis not present

## 2019-03-13 DIAGNOSIS — S59912A Unspecified injury of left forearm, initial encounter: Secondary | ICD-10-CM | POA: Diagnosis not present

## 2019-03-16 DIAGNOSIS — R400 Somnolence: Secondary | ICD-10-CM | POA: Diagnosis not present

## 2019-03-16 DIAGNOSIS — R1310 Dysphagia, unspecified: Secondary | ICD-10-CM | POA: Diagnosis not present

## 2019-03-16 DIAGNOSIS — I480 Paroxysmal atrial fibrillation: Secondary | ICD-10-CM | POA: Diagnosis not present

## 2019-03-16 DIAGNOSIS — I951 Orthostatic hypotension: Secondary | ICD-10-CM | POA: Diagnosis not present

## 2019-04-12 DEATH — deceased

## 2020-04-11 DEATH — deceased
